# Patient Record
Sex: Male | Born: 1990 | Race: White | Hispanic: No | Smoking: Current every day smoker
Health system: Southern US, Community
[De-identification: ages and names within clinical notes are randomized; demographics above are authoritative.]

## PROBLEM LIST (undated history)

## (undated) DIAGNOSIS — K219 Gastro-esophageal reflux disease without esophagitis: Secondary | ICD-10-CM

## (undated) DIAGNOSIS — F4310 Post-traumatic stress disorder, unspecified: Secondary | ICD-10-CM

## (undated) DIAGNOSIS — F419 Anxiety disorder, unspecified: Secondary | ICD-10-CM

## (undated) HISTORY — DX: Gastro-esophageal reflux disease without esophagitis: K21.9

## (undated) HISTORY — PX: KNEE ARTHROSCOPY: SUR90

## (undated) HISTORY — PX: TESTICLE SURGERY: SHX794

## (undated) HISTORY — PX: FOOT ARTHROTOMY: SUR104

---

## 2009-11-12 ENCOUNTER — Emergency Department (HOSPITAL_COMMUNITY): Admission: EM | Admit: 2009-11-12 | Discharge: 2009-11-12 | Payer: Self-pay | Admitting: Emergency Medicine

## 2010-03-27 LAB — URINALYSIS, ROUTINE W REFLEX MICROSCOPIC
Bilirubin Urine: NEGATIVE
Glucose, UA: NEGATIVE mg/dL
Hgb urine dipstick: NEGATIVE
Specific Gravity, Urine: 1.028 (ref 1.005–1.030)
pH: 6.5 (ref 5.0–8.0)

## 2010-03-27 LAB — RAPID URINE DRUG SCREEN, HOSP PERFORMED
Cocaine: NOT DETECTED
Opiates: NOT DETECTED

## 2010-03-27 LAB — POCT I-STAT, CHEM 8
Calcium, Ion: 0.96 mmol/L — ABNORMAL LOW (ref 1.12–1.32)
Chloride: 106 mEq/L (ref 96–112)
Creatinine, Ser: 1.1 mg/dL (ref 0.4–1.5)
Glucose, Bld: 154 mg/dL — ABNORMAL HIGH (ref 70–99)
HCT: 50 % (ref 39.0–52.0)

## 2010-03-27 LAB — CBC
HCT: 46.8 % (ref 39.0–52.0)
Hemoglobin: 17.1 g/dL — ABNORMAL HIGH (ref 13.0–17.0)
MCH: 32.5 pg (ref 26.0–34.0)
MCHC: 36.5 g/dL — ABNORMAL HIGH (ref 30.0–36.0)
RBC: 5.26 MIL/uL (ref 4.22–5.81)

## 2010-03-27 LAB — DIFFERENTIAL
Basophils Relative: 0 % (ref 0–1)
Eosinophils Absolute: 0.1 10*3/uL (ref 0.0–0.7)
Lymphs Abs: 2 10*3/uL (ref 0.7–4.0)
Monocytes Absolute: 0.9 10*3/uL (ref 0.1–1.0)
Monocytes Relative: 6 % (ref 3–12)
Neutro Abs: 13.1 10*3/uL — ABNORMAL HIGH (ref 1.7–7.7)

## 2010-03-27 LAB — URINE MICROSCOPIC-ADD ON

## 2011-01-23 ENCOUNTER — Emergency Department
Admission: EM | Admit: 2011-01-23 | Discharge: 2011-01-23 | Disposition: A | Discharge status: Home / Self Care | Payer: Self-pay | Source: Home / Self Care | Attending: Family Medicine | Admitting: Family Medicine

## 2011-01-23 ENCOUNTER — Encounter: Payer: Self-pay | Admitting: Emergency Medicine

## 2011-01-23 DIAGNOSIS — J029 Acute pharyngitis, unspecified: Secondary | ICD-10-CM

## 2011-01-23 MED ORDER — AMOXICILLIN 875 MG PO TABS: 875.0000 mg | ORAL_TABLET | Freq: Two times a day (BID) | ORAL | Status: AC

## 2011-01-23 NOTE — ED Notes (Signed)
Sore throat, feverish, cough and hoarseness x 3 days. No recent OTCs. Did not get Flu vaccination this season. Working long hours.

## 2011-01-23 NOTE — ED Provider Notes (Signed)
History     CSN: [id number]  Arrival date & time 01/23/11  1151   First MD Initiated Contact with Patient 01/23/11 1227      Chief Complaint  Patient presents with  . Sore Throat      HPI Comments: Patient complains of 3 day history of sore throat and fatigue, with headache and low grade fever.  He has had no sinus congestion.  He developed a very mild cough 2 days ago.  He states that throat does not feel like past strep throats.  No myalgias.  He has not had a flu shot.  No earache.  No GI or GU symptoms.  Patient is a 21 y.o. male presenting with pharyngitis. The history is provided by the patient.  Sore Throat This is a new problem. Episode onset: 3 days ago. The problem occurs constantly. The problem has been gradually worsening. Associated symptoms include headaches. The symptoms are aggravated by swallowing. The symptoms are relieved by nothing. Treatments tried: Daquil and Nyquil. The treatment provided no relief.    History reviewed. No pertinent past medical history.  History reviewed. No pertinent past surgical history.  Family History  Problem Relation Age of Onset  . Hypertension Mother     History  Substance Use Topics  . Smoking status: Current Everyday Smoker  . Smokeless tobacco: Not on file  . Alcohol Use: No      Review of Systems  Neurological: Positive for headaches.   + sore throat No cough No pleuritic pain No wheezing No nasal congestion ? post-nasal drainage No sinus pain/pressure No itchy/red eyes No earache No hemoptysis No SOB + low grade fever/chills No nausea No vomiting No abdominal pain No diarrhea No urinary symptoms No skin rashes + fatigue No myalgias + headache Used OTC meds without relief  Allergies  Sulfa antibiotics  Home Medications   Current Outpatient Rx  Name Route Sig Dispense Refill  . AMOXICILLIN 875 MG PO TABS Oral Take 1 tablet (875 mg total) by mouth 2 (two) times daily. (Rx void after 01/31/11) 20  tablet 0    BP 113/68  Pulse 59  Temp(Src) 98.3 F (36.8 C) (Oral)  Resp 18  Ht 5\' 11"  (1.803 m)  Wt 148 lb (67.132 kg)  BMI 20.64 kg/m2  SpO2 99%  Physical Exam Nursing notes and Vital Signs reviewed. Appearance:  Patient appears healthy, stated age, and in no acute distress Eyes:  Pupils are equal, round, and reactive to light and accomodation.  Extraocular movement is intact.  Conjunctivae are not inflamed  Ears:  Right canal occluded with cerumin.  Left canal and TM normal. Nose:  Mildly congested turbinates.  No sinus tenderness.   Pharynx:  Normal Neck:  Supple.   No adenopathy Lungs:  Clear to auscultation.  Breath sounds are equal.  Heart:  Regular rate and rhythm without murmurs, rubs, or gallops.  Abdomen:  Nontender without masses or hepatosplenomegaly.  Bowel sounds are present.  No CVA or flank tenderness.  Extremities:  No edema.  No calf tenderness Skin:  No rash present.   ED Course  Procedures  none   Labs Reviewed  POCT RAPID STREP A (OFFICE) negative      1. Acute pharyngitis       MDM  There is no evidence of bacterial infection today.  Suspect early viral URI Treat symptomatically for now: Take Mucinex (guaifenesin) twice daily for cough and congestion.  May add Sudafed for sinus congestion  Increase fluid intake,  rest. May use Afrin nasal spray (or generic oxymetazoline) twice daily for about 5 days.  Also recommend using saline nasal spray several times daily and saline nasal irrigation. Stop all antihistamines for now, and other non-prescription cough/cold preparations. May take Delsym Cough Suppressant at bedtime for nighttime cough.  May take Ibuprofen 200mg , 4 tabs every 8 hours with food for sore throat. Begin Amoxicillin if not improving about 5 days or if persistent fever develops. Follow-up with family doctor if not improving 7 to 10 days.  Recommend flu shot when well.        Donna Christen, MD 01/23/11 1256

## 2013-02-19 ENCOUNTER — Emergency Department (INDEPENDENT_AMBULATORY_CARE_PROVIDER_SITE_OTHER): Payer: Self-pay

## 2013-02-19 ENCOUNTER — Emergency Department
Admission: EM | Admit: 2013-02-19 | Discharge: 2013-02-19 | Disposition: A | Discharge status: Home / Self Care | Payer: Self-pay | Source: Home / Self Care | Attending: Family Medicine | Admitting: Family Medicine

## 2013-02-19 ENCOUNTER — Ambulatory Visit (HOSPITAL_BASED_OUTPATIENT_CLINIC_OR_DEPARTMENT_OTHER)
Admit: 2013-02-19 | Discharge: 2013-02-19 | Disposition: A | Discharge status: Home / Self Care | Payer: Self-pay | Attending: Family Medicine | Admitting: Family Medicine

## 2013-02-19 ENCOUNTER — Encounter: Payer: Self-pay | Admitting: Emergency Medicine

## 2013-02-19 DIAGNOSIS — R109 Unspecified abdominal pain: Secondary | ICD-10-CM

## 2013-02-19 DIAGNOSIS — R110 Nausea: Secondary | ICD-10-CM | POA: Insufficient documentation

## 2013-02-19 DIAGNOSIS — M549 Dorsalgia, unspecified: Secondary | ICD-10-CM

## 2013-02-19 DIAGNOSIS — M4120 Other idiopathic scoliosis, site unspecified: Secondary | ICD-10-CM

## 2013-02-19 HISTORY — DX: Post-traumatic stress disorder, unspecified: F43.10

## 2013-02-19 HISTORY — DX: Anxiety disorder, unspecified: F41.9

## 2013-02-19 LAB — POCT URINALYSIS DIP (MANUAL ENTRY)
Blood, UA: NEGATIVE
Glucose, UA: NEGATIVE
LEUKOCYTES UA: NEGATIVE
NITRITE UA: NEGATIVE
PH UA: 7.5 (ref 5–8)
Spec Grav, UA: 1.025 (ref 1.005–1.03)
Urobilinogen, UA: 0.2 (ref 0–1)

## 2013-02-19 LAB — POCT CBC W AUTO DIFF (K'VILLE URGENT CARE)

## 2013-02-19 LAB — COMPREHENSIVE METABOLIC PANEL
ALBUMIN: 4.6 g/dL (ref 3.5–5.2)
ALT: 16 U/L (ref 0–53)
AST: 15 U/L (ref 0–37)
Alkaline Phosphatase: 52 U/L (ref 39–117)
BUN: 8 mg/dL (ref 6–23)
CALCIUM: 9.9 mg/dL (ref 8.4–10.5)
CHLORIDE: 102 meq/L (ref 96–112)
CO2: 28 mEq/L (ref 19–32)
Creat: 0.99 mg/dL (ref 0.50–1.35)
GLUCOSE: 88 mg/dL (ref 70–99)
POTASSIUM: 4 meq/L (ref 3.5–5.3)
Sodium: 138 mEq/L (ref 135–145)
Total Bilirubin: 0.7 mg/dL (ref 0.2–1.2)
Total Protein: 7.2 g/dL (ref 6.0–8.3)

## 2013-02-19 LAB — LIPASE: Lipase: 34 U/L (ref 0–75)

## 2013-02-19 MED ORDER — CEFTRIAXONE SODIUM 1 G IJ SOLR
1.0000 g | Freq: Once | INTRAMUSCULAR | Status: AC
  Administered 2013-02-19: 1 g via INTRAMUSCULAR

## 2013-02-19 MED ORDER — AZITHROMYCIN 250 MG PO TABS: ORAL_TABLET | ORAL | Status: DC

## 2013-02-19 MED ORDER — METRONIDAZOLE 500 MG PO TABS: 500.0000 mg | ORAL_TABLET | Freq: Three times a day (TID) | ORAL | Status: DC

## 2013-02-19 MED ORDER — IOHEXOL 300 MG/ML  SOLN
100.0000 mL | Freq: Once | INTRAMUSCULAR | Status: AC | PRN
  Administered 2013-02-19: 100 mL via INTRAVENOUS

## 2013-02-19 MED ORDER — CIPROFLOXACIN HCL 500 MG PO TABS: 500.0000 mg | ORAL_TABLET | Freq: Two times a day (BID) | ORAL | Status: DC

## 2013-02-19 NOTE — ED Provider Notes (Addendum)
CSN: [id number]     Arrival date & time 02/19/13  1206 History   First MD Initiated Contact with Patient 02/19/13 1241     Chief Complaint  Patient presents with  . Back Pain    HPI The patient presents today with back pain. Location: lumbar spine  Timing: present over last 3-4 das  Description: persistent low back pain. Unrelieved with rest.  Worse with: movement  Better with: nothing  Trauma: no known trauma. Does work in [employer].  Bladder/bowel incontinence: no Weakness: yes Fever/chills: yes. Subjective fevers at home. Pt states that he has had ? Diarrhea (multiple loose BMs) as well as dysuria over course of back pain. No prior hx/o IBD or STDs. One partner.  Night pain:yes Unexplained weight loss: no Cancer/immunosuppression: no PMH of osteoporosis or chronic steroid use:  no   Past Medical History  Diagnosis Date  . Anxiety   . Post traumatic stress disorder (PTSD)    History reviewed. No pertinent past surgical history. Family History  Problem Relation Age of Onset  . Hypertension Mother    History  Substance Use Topics  . Smoking status: Current Every Day Smoker  . Smokeless tobacco: Never Used  . Alcohol Use: No    Review of Systems  All other systems reviewed and are negative.    Allergies  Demerol and Sulfa antibiotics  Home Medications  No current outpatient prescriptions on file. BP 112/69  Pulse 59  Temp(Src) 97.8 F (36.6 C) (Oral)  Resp 20  Ht 5\' 11"  (1.803 m)  Wt 150 lb (68.04 kg)  BMI 20.93 kg/m2  SpO2 100% Physical Exam  Constitutional: He appears well-developed and well-nourished.  HENT:  Head: Normocephalic and atraumatic.  Eyes: Conjunctivae are normal. Pupils are equal, round, and reactive to light.  Neck: Normal range of motion. Neck supple.  Cardiovascular: Normal rate and regular rhythm.   Pulmonary/Chest: Effort normal and breath sounds normal.  Abdominal: Soft.  + TTP > 6/10 in abdomen Most predominant in lower  abdomen.    Genitourinary: Prostate normal and penis normal.  No prostatic TTP    Musculoskeletal:       Arms: Marked TTP in lumbar region    Neurological: He is alert.  Skin: Skin is warm and dry.    ED Course  Procedures (including critical care time) Labs Review Labs Reviewed  POCT URINALYSIS DIP (MANUAL ENTRY)   Imaging Review Dg Lumbar Spine Complete  02/19/2013   CLINICAL DATA:  Low back pain  EXAM: LUMBAR SPINE - COMPLETE 4+ VIEW  COMPARISON:  None.  FINDINGS: Frontal, lateral, spot lumbosacral lateral, and bilateral oblique views were obtained. There are 5 non-rib-bearing lumbar type vertebral bodies. There is thoracic dextroscoliosis. There is no fracture or spondylolisthesis. Disc spaces appear intact. There is no appreciable facet arthropathy.  IMPRESSION: Scoliosis.  No appreciable fracture or arthropathy.   Electronically Signed   By: Bretta BangWilliam  Woodruff M.D.   On: 02/19/2013 13:04      MDM   1. Back pain   2. Abdominal pain, unspecified site    Given constellation of abdominal pain, back pain, ?diarrhea, dysuria, ddx for sxs is extremely broad including lumbar strain, nephrolithiasis, colitis, pyelonephritis, appendicitis, pancreatitis.  L spine xray does show scoliosis. Pt states this is a chronic issue.  No prostatic tenderness on exam today.  UA negative for LE or nitrites.  WBC ULN @ 9.9. Afebrile.  Will place pt on broad spectrum abx coverage.  Rocephin 1gm IM x1 Azithromycin  1gm PO x1  cipro and flagyl  Given degree of abd pain, will send for CT abd and pelvis with IV and oral contrast to better assess intra-abdominal anatomy.  Check STD lab work, also check CMET, lipase, urine culture. NSAIDs for pain.   Will follow up pending imaging.     The patient and/or caregiver has been counseled thoroughly with regard to treatment plan and/or medications prescribed including dosage, schedule, interactions, rationale for use, and possible side effects and they  verbalize understanding. Diagnoses and expected course of recovery discussed and will return if not improved as expected or if the condition worsens. Patient and/or caregiver verbalized understanding.      Clinical update;  CT Abd and Pelvis negative for any intraabdominal pathology. Discussed results at time of read with pt.  Complete treatment course.  Follow up with PCP if sxs persist   Doree Albee, MD 02/19/13 1354  Doree Albee, MD 02/22/13 [phone]

## 2013-02-19 NOTE — ED Notes (Signed)
Patient c/o bilateral low back pain which woke him up 3 nights ago and has not left. Patient tried otc Goody pack without relief.

## 2013-02-20 LAB — HIV ANTIBODY (ROUTINE TESTING W REFLEX): HIV: NONREACTIVE

## 2013-02-20 LAB — RPR

## 2013-02-21 LAB — URINE CULTURE: Colony Count: 4000

## 2013-02-22 LAB — GC/CHLAMYDIA PROBE AMP, URINE
Chlamydia, Swab/Urine, PCR: NEGATIVE
GC PROBE AMP, URINE: NEGATIVE

## 2014-04-01 ENCOUNTER — Emergency Department
Admission: EM | Admit: 2014-04-01 | Discharge: 2014-04-01 | Disposition: A | Discharge status: Home / Self Care | Payer: Self-pay | Source: Home / Self Care | Attending: Family Medicine | Admitting: Family Medicine

## 2014-04-01 ENCOUNTER — Encounter: Payer: Self-pay | Admitting: Emergency Medicine

## 2014-04-01 DIAGNOSIS — L723 Sebaceous cyst: Secondary | ICD-10-CM

## 2014-04-01 DIAGNOSIS — L089 Local infection of the skin and subcutaneous tissue, unspecified: Secondary | ICD-10-CM

## 2014-04-01 MED ORDER — CEPHALEXIN 500 MG PO CAPS: 500.0000 mg | ORAL_CAPSULE | Freq: Three times a day (TID) | ORAL | Status: DC

## 2014-04-01 MED ORDER — HYDROCODONE-ACETAMINOPHEN 5-325 MG PO TABS: 1.0000 | ORAL_TABLET | Freq: Four times a day (QID) | ORAL | Status: DC | PRN

## 2014-04-01 MED ORDER — TRAMADOL HCL 50 MG PO TABS: 50.0000 mg | ORAL_TABLET | Freq: Four times a day (QID) | ORAL | Status: DC | PRN

## 2014-04-01 NOTE — ED Provider Notes (Signed)
Raymond SinnerJoseph Sanders is a 24 y.o. male who presents to Urgent Care today for abscess. Patient has a painful swelling on the posterior neck. He's had a long-standing nontender firm cyst area on the posterior neck. Over the past few days became red and painful. He attempted to drain it himself with a razor blade which did not help. He notes continued pain. He denies any drainage. No fevers or chills.   Past Medical History  Diagnosis Date  . Anxiety   . Post traumatic stress disorder (PTSD)    Past Surgical History  Procedure Laterality Date  . Foot arthrotomy     History  Substance Use Topics  . Smoking status: Current Every Day Smoker  . Smokeless tobacco: Never Used  . Alcohol Use: Yes   ROS as above Medications: No current facility-administered medications for this encounter.   Current Outpatient Prescriptions  Medication Sig Dispense Refill  . cephALEXin (KEFLEX) 500 MG capsule Take 1 capsule (500 mg total) by mouth 3 (three) times daily. 21 capsule 0  . HYDROcodone-acetaminophen (NORCO/VICODIN) 5-325 MG per tablet Take 1 tablet by mouth every 6 (six) hours as needed. 10 tablet 0   Allergies  Allergen Reactions  . Demerol [Meperidine]   . Sulfa Antibiotics      Exam:  BP 108/62 mmHg  Pulse 70  Temp(Src) 98.1 F (36.7 C) (Oral)  Wt 149 lb (67.586 kg)  SpO2 99% Gen: Well NAD Skin:  Large erythematous fluctuant mass posterior neck. Tender to touch.  Abscess incision and drainage. Consent obtained and timeout performed. Skin cleaned with alcohol, and cold spray applied. 2 mL of lidocaine with epinephrine injected achieving good anesthesia. Skin was again cleaned with alcohol. A sharp incision was made to the area of fluctuance. The incision was widened and pus and sebaceous material was expressed. Pus was cultured. Blunt dissection was used to break up loculations. Further pus was expressed.  A large amount of cyst wall was removed. Patient tolerated the procedure well. A  dressing was applied   No results found for this or any previous visit (from the past 24 hour(s)). No results found.  Assessment and Plan: 24 y.o. male with infected sebaceous cyst. Culture pending. Treat with Keflex and Norco. Follow-up with PCP. Recommended follow-up with general surgery for definitive removal when no longer infected.  Discussed warning signs or symptoms. Please see discharge instructions. Patient expresses understanding.     Raymond BongEvan S Aashir Umholtz, MD 04/01/14 [phone]

## 2014-04-01 NOTE — Discharge Instructions (Signed)
Thank you for coming in today. ° °Abscess °Care After °An abscess (also called a boil or furuncle) is an infected area that contains a collection of pus. Signs and symptoms of an abscess include pain, tenderness, redness, or hardness, or you may feel a moveable soft area under your skin. An abscess can occur anywhere in the body. The infection may spread to surrounding tissues causing cellulitis. A cut (incision) by the surgeon was made over your abscess and the pus was drained out. Gauze may have been packed into the space to provide a drain that will allow the cavity to heal from the inside outwards. The boil may be painful for 5 to 7 days. Most people with a boil do not have high fevers. Your abscess, if seen early, may not have localized, and may not have been lanced. If not, another appointment may be required for this if it does not get better on its own or with medications. °HOME CARE INSTRUCTIONS  °· Only take over-the-counter or prescription medicines for pain, discomfort, or fever as directed by your caregiver. °· When you bathe, soak and then remove gauze or iodoform packs at least daily or as directed by your caregiver. You may then wash the wound gently with mild soapy water. Repack with gauze or do as your caregiver directs. °SEEK IMMEDIATE MEDICAL CARE IF:  °· You develop increased pain, swelling, redness, drainage, or bleeding in the wound site. °· You develop signs of generalized infection including muscle aches, chills, fever, or a general ill feeling. °· An oral temperature above 102° F (38.9° C) develops, not controlled by medication. °See your caregiver for a recheck if you develop any of the symptoms described above. If medications (antibiotics) were prescribed, take them as directed. °Document Released: 07/18/2004 Document Revised: 03/24/2011 Document Reviewed: 03/15/2007 °ExitCare® Patient Information ©2015 [employer]. This information is not intended to replace advice given to you by your  health care provider. Make sure you discuss any questions you have with your health care provider. ° °Epidermal Cyst °An epidermal cyst is sometimes called a sebaceous cyst, epidermal inclusion cyst, or infundibular cyst. These cysts usually contain a substance that looks "pasty" or "cheesy" and may have a bad smell. This substance is a protein called keratin. Epidermal cysts are usually found on the face, neck, or trunk. They may also occur in the vaginal area or other parts of the genitalia of both men and women. Epidermal cysts are usually small, painless, slow-growing bumps or lumps that move freely under the skin. It is important not to try to pop them. This may cause an infection and lead to tenderness and swelling. °CAUSES  °Epidermal cysts may be caused by a deep penetrating injury to the skin or a plugged hair follicle, often associated with acne. °SYMPTOMS  °Epidermal cysts can become inflamed and cause: °· Redness. °· Tenderness. °· Increased temperature of the skin over the bumps or lumps. °· Grayish-white, bad smelling material that drains from the bump or lump. °DIAGNOSIS  °Epidermal cysts are easily diagnosed by your caregiver during an exam. Rarely, a tissue sample (biopsy) may be taken to rule out other conditions that may resemble epidermal cysts. °TREATMENT  °· Epidermal cysts often get better and disappear on their own. They are rarely ever cancerous. °· If a cyst becomes infected, it may become inflamed and tender. This may require opening and draining the cyst. Treatment with antibiotics may be necessary. When the infection is gone, the cyst may be removed with   removed with minor surgery.  Small, inflamed cysts can often be treated with antibiotics or by injecting steroid medicines.  Sometimes, epidermal cysts become large and bothersome. If this happens, surgical removal in your caregiver's office may be necessary. HOME CARE INSTRUCTIONS  Only take over-the-counter or prescription medicines as  directed by your caregiver.  Take your antibiotics as directed. Finish them even if you start to feel better. SEEK MEDICAL CARE IF:   Your cyst becomes tender, red, or swollen.  Your condition is not improving or is getting worse.  You have any other questions or concerns. MAKE SURE YOU:  Understand these instructions.  Will watch your condition.  Will get help right away if you are not doing well or get worse. Document Released: 12/01/2003 Document Revised: 03/24/2011 Document Reviewed: 07/08/2010 [facility] Patient Information 2015 [employer], [place]. This information is not intended to replace advice given to you by your health care provider. Make sure you discuss any questions you have with your health care provider.

## 2014-04-01 NOTE — ED Notes (Signed)
Pt c/o cyst on neck that he states has been there for years but got much larger and painful last week. He tried to lance it at home x2 days ago and c/o worsening redness, swelling and pain.

## 2014-04-04 LAB — CULTURE, ROUTINE-ABSCESS: Gram Stain: NONE SEEN

## 2014-04-06 ENCOUNTER — Telehealth: Payer: Self-pay | Admitting: Emergency Medicine

## 2016-08-04 ENCOUNTER — Emergency Department (HOSPITAL_COMMUNITY): Payer: BLUE CROSS/BLUE SHIELD

## 2016-08-04 ENCOUNTER — Encounter (HOSPITAL_COMMUNITY): Payer: Self-pay | Admitting: Emergency Medicine

## 2016-08-04 ENCOUNTER — Emergency Department (HOSPITAL_COMMUNITY)
Admission: EM | Admit: 2016-08-04 | Discharge: 2016-08-04 | Disposition: A | Discharge status: Home / Self Care | Payer: BLUE CROSS/BLUE SHIELD | Attending: Emergency Medicine | Admitting: Emergency Medicine

## 2016-08-04 DIAGNOSIS — R630 Anorexia: Secondary | ICD-10-CM | POA: Diagnosis not present

## 2016-08-04 DIAGNOSIS — Z79899 Other long term (current) drug therapy: Secondary | ICD-10-CM | POA: Diagnosis not present

## 2016-08-04 DIAGNOSIS — R197 Diarrhea, unspecified: Secondary | ICD-10-CM | POA: Diagnosis not present

## 2016-08-04 DIAGNOSIS — R1013 Epigastric pain: Secondary | ICD-10-CM | POA: Diagnosis not present

## 2016-08-04 LAB — COMPREHENSIVE METABOLIC PANEL
ALBUMIN: 4.4 g/dL (ref 3.5–5.0)
ALK PHOS: 49 U/L (ref 38–126)
ALT: 21 U/L (ref 17–63)
ANION GAP: 8 (ref 5–15)
AST: 23 U/L (ref 15–41)
BILIRUBIN TOTAL: 1.1 mg/dL (ref 0.3–1.2)
BUN: 11 mg/dL (ref 6–20)
CO2: 26 mmol/L (ref 22–32)
Calcium: 9.3 mg/dL (ref 8.9–10.3)
Chloride: 108 mmol/L (ref 101–111)
Creatinine, Ser: 0.9 mg/dL (ref 0.61–1.24)
GFR calc Af Amer: >60 mL/min (ref >60)
GFR calc non Af Amer: >60 mL/min (ref >60)
GLUCOSE: 80 mg/dL (ref 65–99)
POTASSIUM: 3.7 mmol/L (ref 3.5–5.1)
Sodium: 142 mmol/L (ref 135–145)
Total Protein: 6.6 g/dL (ref 6.5–8.1)

## 2016-08-04 LAB — CBC
HEMATOCRIT: 40.9 % (ref 39.0–52.0)
HEMOGLOBIN: 14.5 g/dL (ref 13.0–17.0)
MCH: 31.2 pg (ref 26.0–34.0)
MCHC: 35.5 g/dL (ref 30.0–36.0)
MCV: 88 fL (ref 78.0–100.0)
Platelets: 176 10*3/uL (ref 150–400)
RBC: 4.65 MIL/uL (ref 4.22–5.81)
RDW: 12.4 % (ref 11.5–15.5)
WBC: 9 10*3/uL (ref 4.0–10.5)

## 2016-08-04 LAB — URINALYSIS, ROUTINE W REFLEX MICROSCOPIC
BILIRUBIN URINE: NEGATIVE
GLUCOSE, UA: NEGATIVE mg/dL
Hgb urine dipstick: NEGATIVE
KETONES UR: NEGATIVE mg/dL
Leukocytes, UA: NEGATIVE
Nitrite: NEGATIVE
PH: 7 (ref 5.0–8.0)
Protein, ur: NEGATIVE mg/dL
SPECIFIC GRAVITY, URINE: 1.02 (ref 1.005–1.030)

## 2016-08-04 LAB — LIPASE, BLOOD: Lipase: 44 U/L (ref 11–51)

## 2016-08-04 MED ORDER — SUCRALFATE 1 G PO TABS: 1.0000 g | ORAL_TABLET | Freq: Three times a day (TID) | ORAL | 0 refills | Status: DC

## 2016-08-04 MED ORDER — IOPAMIDOL (ISOVUE-300) INJECTION 61%
INTRAVENOUS | Status: AC
  Administered 2016-08-04: 100 mL
  Filled 2016-08-04: qty 100

## 2016-08-04 MED ORDER — OMEPRAZOLE 20 MG PO CPDR: 20.0000 mg | DELAYED_RELEASE_CAPSULE | Freq: Two times a day (BID) | ORAL | 0 refills | Status: DC

## 2016-08-04 MED ORDER — SUCRALFATE 1 GM/10ML PO SUSP: 1.0000 g | Freq: Three times a day (TID) | ORAL | 0 refills | Status: DC

## 2016-08-04 NOTE — ED Provider Notes (Signed)
[facility]-EMERGENCY DEPT Provider Note   CSN: [id number] Arrival date & time: 08/04/16  1641     History   Chief Complaint Chief Complaint  Patient presents with  . Abdominal Pain  . Diarrhea    HPI Raymond Sanders is a 26 y.o. male.  HPI Patient presents with abdominal pain. Has had it for the last month. Since the epigastric area. Somewhat constant but worse after eating. States the pain gets severe. No nausea or vomiting. No diarrhea. No fevers or chills. He states he has lost some weight does not know how much. States he had seen in urgent care and I started him on some over-the-counter medicines. States does not help. Has plans to follow-up with gastroenterology they cannot get him in for another month and half. States he had some diarrhea today. No nausea or vomiting. Pain is worse after drinking alcohol also. Denies other substance abuse.   Past Medical History:  Diagnosis Date  . Anxiety   . Post traumatic stress disorder (PTSD)     There are no active problems to display for this patient.   Past Surgical History:  Procedure Laterality Date  . FOOT ARTHROTOMY         Home Medications    Prior to Admission medications   Medication Sig Start Date End Date Taking? Authorizing Provider  cephALEXin (KEFLEX) 500 MG capsule Take 1 capsule (500 mg total) by mouth 3 (three) times daily. 04/01/14   Rodolph Bongorey, Evan S, MD  HYDROcodone-acetaminophen (NORCO/VICODIN) 5-325 MG per tablet Take 1 tablet by mouth every 6 (six) hours as needed. 04/01/14   Rodolph Bongorey, Evan S, MD  omeprazole (PRILOSEC) 20 MG capsule Take 1 capsule (20 mg total) by mouth 2 (two) times daily before a meal. 08/04/16   Benjiman CorePickering, Kamron Vanwyhe, MD  sucralfate (CARAFATE) 1 g tablet Take 1 tablet (1 g total) by mouth 4 (four) times daily -  with meals and at bedtime. 08/04/16   Benjiman CorePickering, Shandrell Boda, MD  sucralfate (CARAFATE) 1 GM/10ML suspension Take 10 mLs (1 g total) by mouth 4 (four) times daily -  with meals and at bedtime.  08/04/16   Benjiman CorePickering, Aviyon Hocevar, MD    Family History Family History  Problem Relation Age of Onset  . Hypertension Mother     Social History Social History  Substance Use Topics  . Smoking status: Current Every Day Smoker  . Smokeless tobacco: Current User  . Alcohol use Yes     Allergies   Demerol [meperidine] and Sulfa antibiotics   Review of Systems Review of Systems  Constitutional: Positive for appetite change.  HENT: Negative for congestion.   Respiratory: Negative for shortness of breath.   Cardiovascular: Negative for chest pain.  Gastrointestinal: Positive for abdominal pain and diarrhea.  Genitourinary: Negative for hematuria.  Musculoskeletal: Negative for back pain.  Skin: Negative for color change.  Neurological: Negative for numbness.  Hematological: Negative for adenopathy.  Psychiatric/Behavioral: Negative for confusion.     Physical Exam Updated Vital Signs BP 107/68   Pulse (!) 58   Temp 98.4 F (36.9 C) (Oral)   Resp 16   Ht 5\' 10"  (1.778 m)   Wt 68 kg (150 lb)   SpO2 99%   BMI 21.52 kg/m   Physical Exam  Constitutional: He appears well-developed and well-nourished.  HENT:  Head: Normocephalic.  Eyes: EOM are normal.  Neck: Neck supple.  Cardiovascular: Normal rate.   Abdominal: There is tenderness.  Epigastric tenderness without mass rebound or guarding.  Musculoskeletal: Normal  range of motion. He exhibits edema.  Neurological: He is alert.  Skin: Skin is warm. Capillary refill takes less than 2 seconds.  Psychiatric: He has a normal mood and affect.     ED Treatments / Results  Labs (all labs ordered are listed, but only abnormal results are displayed) Labs Reviewed  URINALYSIS, ROUTINE W REFLEX MICROSCOPIC - Abnormal; Notable for the following:       Result Value   APPearance CLOUDY (*)    All other components within normal limits  LIPASE, BLOOD  COMPREHENSIVE METABOLIC PANEL  CBC    EKG  EKG Interpretation None         Radiology Ct Abdomen Pelvis W Contrast  Result Date: 08/04/2016 CLINICAL DATA:  Upper abdominal pain for 1 month. Currently with diarrhea and postprandial pain. EXAM: CT ABDOMEN AND PELVIS WITH CONTRAST TECHNIQUE: Multidetector CT imaging of the abdomen and pelvis was performed using the standard protocol following bolus administration of intravenous contrast. CONTRAST:  ISOVUE-300 IOPAMIDOL (ISOVUE-300) INJECTION 61% COMPARISON:  02/19/2013 FINDINGS: Lower chest: No acute abnormality. Hepatobiliary: No focal liver abnormality is seen. No gallstones, gallbladder wall thickening, or biliary dilatation. Pancreas: Unremarkable. No pancreatic ductal dilatation or surrounding inflammatory changes. Spleen: Normal in size without focal abnormality. Adrenals/Urinary Tract: Adrenal glands are unremarkable. Kidneys are normal, without focal lesion, or hydronephrosis. Bladder is unremarkable. Stomach/Bowel: Stomach is within normal limits. Appendix is normal. No evidence of bowel wall thickening, distention, or inflammatory changes. Vascular/Lymphatic: No significant vascular findings are present. No enlarged abdominal or pelvic lymph nodes. Reproductive: Unremarkable Other: No focal inflammation.  No ascites. Musculoskeletal: No acute or significant osseous findings. IMPRESSION: No significant abnormality. Electronically Signed   By: Ellery Plunk M.D.   On: 08/04/2016 22:30    Procedures Procedures (including critical care time)  Medications Ordered in ED Medications  iopamidol (ISOVUE-300) 61 % injection (100 mLs  Contrast Given 08/04/16 2155)     Initial Impression / Assessment and Plan / ED Course  I have reviewed the triage vital signs and the nursing notes.  Pertinent labs & imaging results that were available during my care of the patient were reviewed by me and considered in my medical decision making (see chart for details).     Patient with abdominal pain and diarrhea.  Epigastric pain. Has had for a month and had been on some antacid without relief. CT scan done and reassuring. Will follow-up with gastroenterology.  Final Clinical Impressions(s) / ED Diagnoses   Final diagnoses:  Epigastric abdominal pain    New Prescriptions Discharge Medication List as of 08/04/2016 10:38 PM    START taking these medications   Details  omeprazole (PRILOSEC) 20 MG capsule Take 1 capsule (20 mg total) by mouth 2 (two) times daily before a meal., Starting Mon 08/04/2016, Print    sucralfate (CARAFATE) 1 g tablet Take 1 tablet (1 g total) by mouth 4 (four) times daily -  with meals and at bedtime., Starting Mon 08/04/2016, Print    sucralfate (CARAFATE) 1 GM/10ML suspension Take 10 mLs (1 g total) by mouth 4 (four) times daily -  with meals and at bedtime., Starting Mon 08/04/2016, Print         Benjiman Core, MD 08/04/16 [phone]

## 2016-08-04 NOTE — ED Notes (Signed)
Patient seen leaving ED and heard stating he was "going to get some food"

## 2016-08-04 NOTE — ED Notes (Signed)
Called pt X2 for urine sample

## 2016-08-04 NOTE — ED Notes (Signed)
Pt verbalized understanding of d/c instructions and has no further questions. Pt is stable, A&Ox4, VSS.  

## 2016-08-04 NOTE — ED Triage Notes (Signed)
Pt. Stated, I've had stomach pain for a month.  I had diarrhea today. When I have the stomach pain, Ive not been able to eat.  I have noticed the sclera in my eyes are yellow.

## 2016-08-08 ENCOUNTER — Encounter: Payer: Self-pay | Admitting: Gastroenterology

## 2016-08-13 ENCOUNTER — Encounter: Payer: Self-pay | Admitting: Gastroenterology

## 2016-08-13 ENCOUNTER — Ambulatory Visit (INDEPENDENT_AMBULATORY_CARE_PROVIDER_SITE_OTHER): Payer: BLUE CROSS/BLUE SHIELD | Admitting: Gastroenterology

## 2016-08-13 ENCOUNTER — Telehealth: Payer: Self-pay | Admitting: Gastroenterology

## 2016-08-13 VITALS — BP 84/52 | HR 68 | Ht 70.0 in | Wt 142.0 lb

## 2016-08-13 DIAGNOSIS — R1013 Epigastric pain: Secondary | ICD-10-CM | POA: Diagnosis not present

## 2016-08-13 DIAGNOSIS — R1310 Dysphagia, unspecified: Secondary | ICD-10-CM | POA: Diagnosis not present

## 2016-08-13 DIAGNOSIS — R634 Abnormal weight loss: Secondary | ICD-10-CM

## 2016-08-13 MED ORDER — TRAMADOL HCL 50 MG PO TABS: 50.0000 mg | ORAL_TABLET | Freq: Three times a day (TID) | ORAL | 0 refills | Status: DC | PRN

## 2016-08-13 MED ORDER — OMEPRAZOLE 40 MG PO CPDR: 40.0000 mg | DELAYED_RELEASE_CAPSULE | Freq: Two times a day (BID) | ORAL | 2 refills | Status: DC

## 2016-08-13 MED ORDER — SUCRALFATE 1 GM/10ML PO SUSP: 1.0000 g | Freq: Three times a day (TID) | ORAL | 1 refills | Status: DC

## 2016-08-13 NOTE — Telephone Encounter (Signed)
Tramadol faxed to [facility].

## 2016-08-13 NOTE — Patient Instructions (Signed)
You have been scheduled for an endoscopy. Please follow written instructions given to you at your visit today. If you use inhalers (even only as needed), please bring them with you on the day of your procedure. Your physician has requested that you go to www.startemmi.com and enter the access code given to you at your visit today. This web site gives a general overview about your procedure. However, you should still follow specific instructions given to you by our office regarding your preparation for the procedure.  We have sent the following medications to your pharmacy for you to pick up at your convenience: Omeprazole Carafate Tramadol

## 2016-08-13 NOTE — Progress Notes (Addendum)
08/13/2016 Raymond Sanders [id number] March 17, 1990   HISTORY OF PRESENT ILLNESS:  This is a pleasant 26 year old male who is new to our office. He presents to our office today with complaints of about 6 weeks of epigastric abdominal pain. He says that initially he was only having pain when he would eat and the food seemed to travel through that area and cause an excruciating amount of pain. Now it is to the point where he has having chronic pain/discomfort but much worse when he eats. He says that he knows he does not eat right and previously he had been going all day without eating and then eating extremely large meals at dinnertime. He has tried to completely change his diet and his eating habits, but really has not been wanting to eat because of the pain. He says he lost 8 pounds in about 2 weeks. He denies NSAID use.  He says that he may have had intermittent heartburn and reflux, but nothing on a regular basis in the past. He was seen in the emergency room where a CT scan of the abdomen and pelvis with contrast was normal. CBC, CMP, lipase, urinalysis were normal. He was given omeprazole 20 mg twice daily, Carafate tablet 4 times a day, and told to follow-up with GI. He really does not really think the medication has helped much thus far. He has been on medications for about the past week.  Referred by Lindaann PascalScott Long, PA-C.   Past Medical History:  Diagnosis Date  . Anxiety   . GERD (gastroesophageal reflux disease)   . Post traumatic stress disorder (PTSD)    Past Surgical History:  Procedure Laterality Date  . FOOT ARTHROTOMY    . KNEE ARTHROSCOPY    . TESTICLE SURGERY      reports that he has been smoking Cigarettes.  He uses smokeless tobacco. He reports that he drinks alcohol. He reports that he uses drugs, including Marijuana. family history includes Hypertension in his mother. Allergies  Allergen Reactions  . Demerol [Meperidine] Other (See Comments)    Anger increase   . Sulfa  Antibiotics Hives      Outpatient Encounter Prescriptions as of 08/13/2016  Medication Sig  . APPLE CIDER VINEGAR PO Take by mouth.  . Omega-3 Fatty Acids (FISH OIL CONCENTRATE PO) Take by mouth.  . [DISCONTINUED] omeprazole (PRILOSEC) 20 MG capsule Take 1 capsule (20 mg total) by mouth 2 (two) times daily before a meal.  . [DISCONTINUED] sucralfate (CARAFATE) 1 g tablet Take 1 tablet (1 g total) by mouth 4 (four) times daily -  with meals and at bedtime.  Marland Kitchen. omeprazole (PRILOSEC) 40 MG capsule Take 1 capsule (40 mg total) by mouth 2 (two) times daily.  . sucralfate (CARAFATE) 1 GM/10ML suspension Take 10 mLs (1 g total) by mouth 4 (four) times daily -  with meals and at bedtime.  . traMADol (ULTRAM) 50 MG tablet Take 1 tablet (50 mg total) by mouth every 8 (eight) hours as needed.  . [DISCONTINUED] cephALEXin (KEFLEX) 500 MG capsule Take 1 capsule (500 mg total) by mouth 3 (three) times daily.  . [DISCONTINUED] HYDROcodone-acetaminophen (NORCO/VICODIN) 5-325 MG per tablet Take 1 tablet by mouth every 6 (six) hours as needed.  . [DISCONTINUED] sucralfate (CARAFATE) 1 GM/10ML suspension Take 10 mLs (1 g total) by mouth 4 (four) times daily -  with meals and at bedtime.   No facility-administered encounter medications on file as of 08/13/2016.      REVIEW  OF SYSTEMS  : All other systems reviewed and negative except where noted in the History of Present Illness.   PHYSICAL EXAM: BP (!) 84/52   Pulse 68   Ht 5\' 10"  (1.778 m)   Wt 142 lb (64.4 kg)   BMI 20.37 kg/m  General: Well developed white male in no acute distress; somewhat anxious Head: Normocephalic and atraumatic Eyes:  Sclerae anicteric, conjunctiva pink. Ears: Normal auditory acuity Lungs: Clear throughout to auscultation; no increased WOB. Heart: Regular rate and rhythm; no M/R/G. Abdomen: Soft, non-distended.  BS present.  Mild to moderate TTP in epigastrium. Musculoskeletal: Symmetrical with no gross deformities  Skin: No  lesions on visible extremities Extremities: No edema  Neurological: Alert oriented x 4, grossly non-focal Psychological:  Alert and cooperative. Normal mood and affect  ASSESSMENT AND PLAN: -26 year old male with about 6 weeks of epigastric abdominal pain.  First occurred only when he would eat and food travelled down his esophagus, but now having constant discomfort/pain.  Says that he has lost 8 pounds in 2 weeks due to not wanting to eat.  Will increase omeprazole to 40 mg BID for now, will continue carafate but will give suspension instead.  Will schedule for EGD, which is going to be done by Dr. Myrtie Neitheranis later this week.  He is asking for a small amount of pain medication and I explained to him that we do not usually provide pain medication here especially when we don't even have a definite diagnosis that we are treating. He was understanding and I do not think that he is drug seeking. I did agree to give him a very small amount of tramadol to last the next 2 days until his procedure.  *The risks, benefits, and alternatives to EGD were discussed with the patient and he consents to proceed.   CC:  Scott Long, PA-C  Thank you for sending this case to me. I have reviewed the entire note, and the outlined plan seems appropriate.   Amada JupiterHenry Danis, MD

## 2016-08-14 ENCOUNTER — Telehealth: Payer: Self-pay | Admitting: Gastroenterology

## 2016-08-14 ENCOUNTER — Telehealth: Payer: Self-pay

## 2016-08-14 DIAGNOSIS — R634 Abnormal weight loss: Secondary | ICD-10-CM

## 2016-08-14 DIAGNOSIS — R1013 Epigastric pain: Secondary | ICD-10-CM

## 2016-08-14 NOTE — Telephone Encounter (Signed)
Patient called and left message that his BP was 84/52 at his office visit yesterday and he felt dizzy upon standing this morning. I returned patient's call. Patient confirms above complaints, states he felt dizzy upon standing this morning, felt like he was going to pass out and has a general feeling of malaise and headache since yesterday. Patient has not checked his BP since his office visit yesterday. I asked the patient if he has been drinking enough fluids and he said he drinks gatorade, juice and water regularly. I see the patient was prescribed Ultram at his office visit yesterday and asked him if his symptoms began after he took the Ultram, but patient states he hadn't even had it filled yet when his symptoms began. I asked the patient if he has had these symptoms before and he states he does experience dizziness a couple of times a month. Patient states he does not have a PCP as he's been fairly healthy  when I asked him. I suggested to patient that he increase his fluid intake and that I will consult with Dr. Myrtie Neitheranis and get back with him. Patient also asks if this could be thyroid related and if we can check thyroid levels when he comes in for his EGD tomorrow. I told patient that we will address this issue of dizziness, but encouraged him to find a PCP.

## 2016-08-14 NOTE — Telephone Encounter (Signed)
Spoke with patient after receiving instructions from Dr. Myrtie Neitheranis. Instructed patient to continue to drink fluids and that we are moving his procedure up to 0830 tomorrow morning. No solid food after midnight tonight. Stop drinking fluids at 0530 tomorrow morning (don't even have the smallest sip of water after 0530). Please stop by our lab in the basement at 0730 tomorrow to have blood drawn for thyroid levels prior to your procedure. Patient verbalizes understanding of above and states that he will look into obtaining a PCP at our primary care clinic on the first floor.

## 2016-08-14 NOTE — Telephone Encounter (Signed)
Patient is already aware of the new plan. Labs entered. He asked about additional labs. I instructed him to schedule with a PCP. When he was in clinic I told him to stop at [facility] primary on the first floor, he admits he did not do that, again I recommend he establish with a PCP. He states clear understanding.

## 2016-08-14 NOTE — Telephone Encounter (Signed)
Oral fluids today We'll see what his BP is tomorrow. Needs PCP to address concerns of thyroid

## 2016-08-14 NOTE — Telephone Encounter (Signed)
New plan:   looks like I have an 8:30 procedure slot open tomorrow (had a cancellation). Please have this patient come to the lab in our building at 7:30, have a TSH, free T4 and cortisol level drawn.  [facility].

## 2016-08-15 ENCOUNTER — Ambulatory Visit (AMBULATORY_SURGERY_CENTER): Payer: BLUE CROSS/BLUE SHIELD | Admitting: Gastroenterology

## 2016-08-15 ENCOUNTER — Other Ambulatory Visit (INDEPENDENT_AMBULATORY_CARE_PROVIDER_SITE_OTHER): Payer: BLUE CROSS/BLUE SHIELD

## 2016-08-15 ENCOUNTER — Encounter: Payer: Self-pay | Admitting: Gastroenterology

## 2016-08-15 VITALS — BP 92/44 | HR 58 | Temp 98.9°F | Resp 11 | Ht 70.0 in | Wt 142.0 lb

## 2016-08-15 DIAGNOSIS — R1013 Epigastric pain: Secondary | ICD-10-CM | POA: Diagnosis not present

## 2016-08-15 DIAGNOSIS — R634 Abnormal weight loss: Secondary | ICD-10-CM | POA: Diagnosis not present

## 2016-08-15 DIAGNOSIS — K3189 Other diseases of stomach and duodenum: Secondary | ICD-10-CM

## 2016-08-15 LAB — TSH: TSH: 5.12 u[IU]/mL — ABNORMAL HIGH (ref 0.35–4.50)

## 2016-08-15 LAB — T4, FREE: FREE T4: 0.97 ng/dL (ref 0.60–1.60)

## 2016-08-15 LAB — CORTISOL: CORTISOL PLASMA: 13.5 ug/dL

## 2016-08-15 MED ORDER — SODIUM CHLORIDE 0.9 % IV SOLN: 500.0000 mL | INTRAVENOUS | Status: DC

## 2016-08-15 NOTE — Patient Instructions (Signed)
Impression/Recommendations:  Resume previous diet. Continue present medications.  YOU HAD AN ENDOSCOPIC PROCEDURE TODAY AT THE [facility]:   Refer to the procedure report that was given to you for any specific questions about what was found during the examination.  If the procedure report does not answer your questions, please call your gastroenterologist to clarify.  If you requested that your care partner not be given the details of your procedure findings, then the procedure report has been included in a sealed envelope for you to review at your convenience later.  YOU SHOULD EXPECT: Some feelings of bloating in the abdomen. Passage of more gas than usual.  Walking can help get rid of the air that was put into your GI tract during the procedure and reduce the bloating. If you had a lower endoscopy (such as a colonoscopy or flexible sigmoidoscopy) you may notice spotting of blood in your stool or on the toilet paper. If you underwent a bowel prep for your procedure, you may not have a normal bowel movement for a few days.  Please Note:  You might notice some irritation and congestion in your nose or some drainage.  This is from the oxygen used during your procedure.  There is no need for concern and it should clear up in a day or so.  SYMPTOMS TO REPORT IMMEDIATELY:  Following upper endoscopy (EGD)  Vomiting of blood or coffee ground material  New chest pain or pain under the shoulder blades  Painful or persistently difficult swallowing  New shortness of breath  Fever of 100F or higher  Black, tarry-looking stools  For urgent or emergent issues, a gastroenterologist can be reached at any hour by calling [phone].   DIET:  We do recommend a small meal at first, but then you may proceed to your regular diet.  Drink plenty of fluids but you should avoid alcoholic beverages for 24 hours.  ACTIVITY:  You should plan to take it easy for the rest of today and you should NOT  DRIVE or use heavy machinery until tomorrow (because of the sedation medicines used during the test).    FOLLOW UP: Our staff will call the number listed on your records the next business day following your procedure to check on you and address any questions or concerns that you may have regarding the information given to you following your procedure. If we do not reach you, we will leave a message.  However, if you are feeling well and you are not experiencing any problems, there is no need to return our call.  We will assume that you have returned to your regular daily activities without incident.  If any biopsies were taken you will be contacted by phone or by letter within the next 1-3 weeks.  Please call us at [phone] if you have not heard about the biopsies in 3 weeks.    SIGNATURES/CONFIDENTIALITY: You and/or your care partner have signed paperwork which will be entered into your electronic medical record.  These signatures attest to the fact that that the information above on your After Visit Summary has been reviewed and is understood.  Full responsibility of the confidentiality of this discharge information lies with you and/or your care-partner. 

## 2016-08-15 NOTE — Progress Notes (Signed)
Called to room to assist during endoscopic procedure.  Patient ID and intended procedure confirmed with present staff. Received instructions for my participation in the procedure from the performing physician.  

## 2016-08-15 NOTE — Op Note (Signed)
[facility] Patient Name: Raymond SinnerJoseph Shuping Procedure Date: 08/15/2016 8:23 AM MRN: [id number] Endoscopist: Sherilyn CooterHenry L. Myrtie Neitheranis , MD Age: 26 Referring MD:  Date of Birth: [birth year] Gender: Male Account #: [id number] Procedure:                Upper GI endoscopy Indications:              Epigastric abdominal pain, Weight loss Medicines:                Monitored Anesthesia Care Procedure:                Pre-Anesthesia Assessment:                           - Prior to the procedure, a History and Physical                            was performed, and patient medications and                            allergies were reviewed. The patient's tolerance of                            previous anesthesia was also reviewed. The risks                            and benefits of the procedure and the sedation                            options and risks were discussed with the patient.                            All questions were answered, and informed consent                            was obtained. Prior Anticoagulants: The patient has                            taken no previous anticoagulant or antiplatelet                            agents. ASA Grade Assessment: II - A patient with                            mild systemic disease. After reviewing the risks                            and benefits, the patient was deemed in                            satisfactory condition to undergo the procedure.                           After obtaining informed consent, the endoscope was  passed under direct vision. Throughout the                            procedure, the patient's blood pressure, pulse, and                            oxygen saturations were monitored continuously. The                            Endoscope was introduced through the mouth, and                            advanced to the second part of duodenum. The upper                            GI endoscopy was  accomplished without difficulty.                            The patient tolerated the procedure well. Scope In: Scope Out: Findings:                 The esophagus was normal.                           The entire examined stomach was normal. Biopsies                            were taken with a cold forceps for histology to                            rule out H. pylori.                           The cardia and gastric fundus were normal on                            retroflexion.                           The examined duodenum was normal. Complications:            No immediate complications. Estimated Blood Loss:     Estimated blood loss: none. Impression:               - Normal esophagus.                           - Normal stomach. Biopsied.                           - Normal examined duodenum.                           No visible cause for symptoms seen on this exam.                           Recent CTAP scan and CBC/CMP normal. TSH  and                            cortisol level pending. Recommendation:           - Patient has a contact number available for                            emergencies. The signs and symptoms of potential                            delayed complications were discussed with the                            patient. Return to normal activities tomorrow.                            Written discharge instructions were provided to the                            patient.                           - Resume previous diet.                           - Continue present medications.                           - Await pathology results.                           - The patient was advised to seek primary care                            evaluation. Henry L. Myrtie Neither, MD 08/15/2016 8:51:22 AM This report has been signed electronically.

## 2016-08-18 ENCOUNTER — Telehealth: Payer: Self-pay | Admitting: *Deleted

## 2016-08-18 NOTE — Telephone Encounter (Signed)
Left message on f/u call 

## 2016-08-19 ENCOUNTER — Telehealth: Payer: Self-pay | Admitting: *Deleted

## 2016-08-19 ENCOUNTER — Telehealth: Payer: Self-pay | Admitting: Family

## 2016-08-19 NOTE — Telephone Encounter (Signed)
GI patient.  Last seen and told he needed PCP to treat stress.  Tammy SoursGreg, can you take patient on?

## 2016-08-19 NOTE — Telephone Encounter (Signed)
No answer for second call back. Left patient message to call with any questions or concerns. SM

## 2016-08-19 NOTE — Telephone Encounter (Signed)
Pt called back and doing okay.

## 2016-08-25 NOTE — Telephone Encounter (Signed)
I am unable to take on new patients at this time.

## 2016-08-26 NOTE — Telephone Encounter (Signed)
Dr. Jones would you be able to take patient on? °

## 2016-09-29 NOTE — Telephone Encounter (Signed)
Left patient vm to call back to establish care with shambley.

## 2017-08-19 ENCOUNTER — Encounter: Payer: Self-pay | Admitting: Gastroenterology

## 2017-08-21 ENCOUNTER — Other Ambulatory Visit: Payer: Self-pay

## 2017-08-21 ENCOUNTER — Encounter: Payer: Self-pay | Admitting: Emergency Medicine

## 2017-08-21 ENCOUNTER — Emergency Department (INDEPENDENT_AMBULATORY_CARE_PROVIDER_SITE_OTHER)
Admission: EM | Admit: 2017-08-21 | Discharge: 2017-08-21 | Disposition: A | Discharge status: Home / Self Care | Payer: 59 | Source: Home / Self Care

## 2017-08-21 NOTE — ED Provider Notes (Signed)
[facility], CMA, who triaged pt, pt was accompanied by mother. Per direction of his mother, pt decided not to be evaluated due to [facility] stating certain controlled substances, including the requested Xanax, would not be prescribed today.   NO CHARGE. Pt was not evaluated by provider per pt's choice.    Lurene Shadowhelps, Toney Lizaola O, [place]-C 08/21/17 1109

## 2017-08-21 NOTE — ED Triage Notes (Signed)
Pt c/o of cp & anxiety. Mother presents at bedside. States he needs Xanax because he needs to work. Instructed he signed a prescription policy and none of those meds are given. Pt LWOBS by provider

## 2017-09-22 ENCOUNTER — Ambulatory Visit: Payer: BLUE CROSS/BLUE SHIELD | Admitting: Gastroenterology

## 2018-10-14 ENCOUNTER — Encounter (HOSPITAL_COMMUNITY): Payer: Self-pay | Admitting: Emergency Medicine

## 2018-10-14 ENCOUNTER — Other Ambulatory Visit: Payer: Self-pay

## 2018-10-14 ENCOUNTER — Emergency Department (HOSPITAL_COMMUNITY)
Admission: EM | Admit: 2018-10-14 | Discharge: 2018-10-14 | Disposition: A | Discharge status: Home / Self Care | Payer: Self-pay | Attending: Emergency Medicine | Admitting: Emergency Medicine

## 2018-10-14 ENCOUNTER — Emergency Department (HOSPITAL_COMMUNITY): Payer: Self-pay

## 2018-10-14 DIAGNOSIS — Z20828 Contact with and (suspected) exposure to other viral communicable diseases: Secondary | ICD-10-CM | POA: Insufficient documentation

## 2018-10-14 DIAGNOSIS — Z79899 Other long term (current) drug therapy: Secondary | ICD-10-CM | POA: Insufficient documentation

## 2018-10-14 DIAGNOSIS — F17210 Nicotine dependence, cigarettes, uncomplicated: Secondary | ICD-10-CM | POA: Insufficient documentation

## 2018-10-14 DIAGNOSIS — J189 Pneumonia, unspecified organism: Secondary | ICD-10-CM | POA: Insufficient documentation

## 2018-10-14 LAB — CBC WITH DIFFERENTIAL/PLATELET
Abs Immature Granulocytes: 0.03 10*3/uL (ref 0.00–0.07)
Basophils Absolute: 0 10*3/uL (ref 0.0–0.1)
Basophils Relative: 0 %
Eosinophils Absolute: 0.3 10*3/uL (ref 0.0–0.5)
Eosinophils Relative: 3 %
HCT: 43.8 % (ref 39.0–52.0)
Hemoglobin: 15.8 g/dL (ref 13.0–17.0)
Immature Granulocytes: 0 %
Lymphocytes Relative: 35 %
Lymphs Abs: 2.6 10*3/uL (ref 0.7–4.0)
MCH: 33.3 pg (ref 26.0–34.0)
MCHC: 36.1 g/dL — ABNORMAL HIGH (ref 30.0–36.0)
MCV: 92.4 fL (ref 80.0–100.0)
Monocytes Absolute: 0.6 10*3/uL (ref 0.1–1.0)
Monocytes Relative: 8 %
Neutro Abs: 3.9 10*3/uL (ref 1.7–7.7)
Neutrophils Relative %: 54 %
Platelets: 195 10*3/uL (ref 150–400)
RBC: 4.74 MIL/uL (ref 4.22–5.81)
RDW: 11.9 % (ref 11.5–15.5)
WBC: 7.4 10*3/uL (ref 4.0–10.5)
nRBC: 0 % (ref 0.0–0.2)

## 2018-10-14 LAB — BASIC METABOLIC PANEL
Anion gap: 8 (ref 5–15)
BUN: 23 mg/dL — ABNORMAL HIGH (ref 6–20)
CO2: 24 mmol/L (ref 22–32)
Calcium: 9.2 mg/dL (ref 8.9–10.3)
Chloride: 106 mmol/L (ref 98–111)
Creatinine, Ser: 1.29 mg/dL — ABNORMAL HIGH (ref 0.61–1.24)
GFR calc Af Amer: >60 mL/min (ref >60)
GFR calc non Af Amer: >60 mL/min (ref >60)
Glucose, Bld: 89 mg/dL (ref 70–99)
Potassium: 3.9 mmol/L (ref 3.5–5.1)
Sodium: 138 mmol/L (ref 135–145)

## 2018-10-14 MED ORDER — ALBUTEROL SULFATE HFA 108 (90 BASE) MCG/ACT IN AERS
2.0000 | INHALATION_SPRAY | RESPIRATORY_TRACT | Status: DC | PRN
  Administered 2018-10-14: 2 via RESPIRATORY_TRACT
  Filled 2018-10-14: qty 6.7

## 2018-10-14 MED ORDER — DOXYCYCLINE HYCLATE 100 MG PO CAPS: 100.0000 mg | ORAL_CAPSULE | Freq: Two times a day (BID) | ORAL | 0 refills | Status: DC

## 2018-10-14 MED ORDER — PREDNISONE 20 MG PO TABS: 40.0000 mg | ORAL_TABLET | Freq: Every day | ORAL | 0 refills | Status: DC

## 2018-10-14 NOTE — ED Triage Notes (Signed)
Patient reports SOB with chest congestion and productive cough this morning , denies fever or chills .

## 2018-10-14 NOTE — ED Provider Notes (Signed)
[place] EMERGENCY DEPARTMENT Provider Note   CSN: [id number] Arrival date & time: 10/14/18  0457     History   Chief Complaint Chief Complaint  Patient presents with  . Shortness of Breath  . Cough    HPI Raymond [facility] is a 28 y.o. male.     HPI Patient presents with cough and shortness of breath.  Began around 3 in the morning.  States it was so bad he had to lie in the floor.  Mild sputum production.  No fevers.  States he does feel kind of weak all over.  He is a smoker.  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  Also worried about fungus because when he is exposed to.  Some dull chest pain.  States he is otherwise healthy. Past Medical History:  Diagnosis Date  . Anxiety   . GERD (gastroesophageal reflux disease)   . Post traumatic stress disorder (PTSD)     Patient Active Problem List   Diagnosis Date Noted  . Abdominal pain, epigastric 08/13/2016  . Odynophagia 08/13/2016  . Weight loss 08/13/2016    Past Surgical History:  Procedure Laterality Date  . FOOT ARTHROTOMY    . KNEE ARTHROSCOPY    . TESTICLE SURGERY          Home Medications    Prior to Admission medications   Medication Sig Start Date End Date Taking? Authorizing Provider  APPLE CIDER VINEGAR PO Take by mouth.    [provider]  doxycycline (VIBRAMYCIN) 100 MG capsule Take 1 capsule (100 mg total) by mouth 2 (two) times daily. 10/14/18   Davonna Belling, MD  HYDROcodone-acetaminophen (NORCO) 10-325 MG tablet Take 1 tablet by mouth every 6 (six) hours as needed.    [provider]  Omega-3 Fatty Acids (FISH OIL CONCENTRATE PO) Take by mouth.    [provider]  omeprazole (PRILOSEC) 40 MG capsule Take 1 capsule (40 mg total) by mouth 2 (two) times daily. 08/13/16   Zehr, Laban Emperor, PA-C  predniSONE (DELTASONE) 20 MG tablet Take 2 tablets (40 mg total) by mouth daily. 10/14/18   Davonna Belling, MD   sucralfate (CARAFATE) 1 GM/10ML suspension Take 10 mLs (1 g total) by mouth 4 (four) times daily -  with meals and at bedtime. 08/13/16   Zehr, Laban Emperor, PA-C  traMADol (ULTRAM) 50 MG tablet Take 1 tablet (50 mg total) by mouth every 8 (eight) hours as needed. 08/13/16   Zehr, Laban Emperor, PA-C    Family History Family History  Problem Relation Age of Onset  . Hypertension Mother   . Colon cancer Neg Hx   . Esophageal cancer Neg Hx   . Stomach cancer Neg Hx     Social History Social History   Tobacco Use  . Smoking status: Current Every Day Smoker    Types: Cigarettes  . Smokeless tobacco: Current User  Substance Use Topics  . Alcohol use: Yes  . Drug use: Yes    Types: Marijuana     Allergies   Demerol [meperidine] and Sulfa antibiotics   Review of Systems Review of Systems  Constitutional: Positive for appetite change and fatigue.  HENT: Negative for congestion.   Respiratory: Positive for cough and shortness of breath.   Cardiovascular: Positive for chest pain.  Gastrointestinal: Negative for abdominal pain.  Genitourinary: Negative for dysuria.  Musculoskeletal: Positive for myalgias.  Skin: Negative for rash.  Neurological: Negative  for weakness.     Physical Exam Updated Vital Signs BP 113/82 (BP Location: Right Arm)   Pulse (!) 52   Temp 97.7 F (36.5 C) (Oral)   Resp 16   Ht 5\' 10"  (1.778 m)   Wt 70.3 kg   SpO2 98%   BMI 22.24 kg/m   Physical Exam Vitals signs and nursing note reviewed.  Constitutional:      Appearance: He is well-developed.  HENT:     Head: Atraumatic.  Neck:     Musculoskeletal: Neck supple.  Cardiovascular:     Rate and Rhythm: Regular rhythm. Bradycardia present.  Pulmonary:     Comments: Rhonchi right mid lung fields.  No respiratory distress. Chest:     Chest wall: No tenderness.  Abdominal:     Tenderness: There is no abdominal tenderness.  Musculoskeletal:     Right lower leg: No edema.     Left lower leg: No  edema.  Skin:    General: Skin is warm.     Capillary Refill: Capillary refill takes less than 2 seconds.  Neurological:     Mental Status: He is oriented to person, place, and time.      ED Treatments / Results  Labs (all labs ordered are listed, but only abnormal results are displayed) Labs Reviewed  CBC WITH DIFFERENTIAL/PLATELET - Abnormal; Notable for the following components:      Result Value   MCHC 36.1 (*)    All other components within normal limits  BASIC METABOLIC PANEL - Abnormal; Notable for the following components:   BUN 23 (*)    Creatinine, Ser 1.29 (*)    All other components within normal limits  NOVEL CORONAVIRUS, NAA (HOSP ORDER, SEND-OUT TO REF LAB; TAT 18-24 HRS)    EKG EKG Interpretation  Date/Time:  Thursday October 14 2018 05:07:57 EDT Ventricular Rate:  46 PR Interval:  166 QRS Duration: 116 QT Interval:  430 QTC Calculation: 376 R Axis:   86 Text Interpretation:  Sinus bradycardia Lateral infarct , age undetermined Abnormal ECG No previous ECGs available Confirmed by [id number] (Zadie Rhine) on 10/14/2018 5:12:41 AM   Radiology Dg Chest 2 View  Result Date: 10/14/2018 CLINICAL DATA:  Shortness of breath and productive cough. EXAM: CHEST - 2 VIEW COMPARISON:  None. FINDINGS: Normal heart size and mediastinal contours. No acute infiltrate or edema. No effusion or pneumothorax. No acute osseous findings. Artifact from EKG leads IMPRESSION: Negative chest. Electronically Signed   By: 12/14/2018 M.D.   On: 10/14/2018 05:55    Procedures Procedures (including critical care time)  Medications Ordered in ED Medications  albuterol (VENTOLIN HFA) 108 (90 Base) MCG/ACT inhaler 2 puff (2 puffs Inhalation Given 10/14/18 0847)     Initial Impression / Assessment and Plan / ED Course  I have reviewed the triage vital signs and the nursing notes.  Pertinent labs & imaging results that were available during my care of the patient were reviewed by  me and considered in my medical decision making (see chart for details).        Patient with cough fatigue.  Mild bradycardia and mild hypotension appear to be baseline for the patient.  X-ray reassuring but does have localizing lung findings on auscultation.  Will treat as a clinical pneumonia.  Will give steroids and antibiotics.  Also will give COVID test.return as an outpatient.  Paulmichael Schreck was evaluated in Emergency Department on 10/14/2018 for the symptoms described in the history of present illness.  He was evaluated in the context of the global COVID-19 pandemic, which necessitated consideration that the patient might be at risk for infection with the SARS-CoV-2 virus that causes COVID-19. Institutional protocols and algorithms that pertain to the evaluation of patients at risk for COVID-19 are in a state of rapid change based on information released by regulatory bodies including the CDC and federal and state organizations. These policies and algorithms were followed during the patient's care in the ED.  Final Clinical Impressions(s) / ED Diagnoses   Final diagnoses:  Community acquired pneumonia of right lung, unspecified part of lung    ED Discharge Orders         Ordered    predniSONE (DELTASONE) 20 MG tablet  Daily     10/14/18 0858    doxycycline (VIBRAMYCIN) 100 MG capsule  2 times daily     10/14/18 0858           Benjiman CorePickering, Akaysha Cobern, MD 10/14/18 [phone]

## 2018-10-14 NOTE — Discharge Instructions (Addendum)
Follow-up with a primary care doctor as needed.  You also have had a COVID test done and the results should come back in 1 to 2 days.

## 2018-10-15 LAB — NOVEL CORONAVIRUS, NAA (HOSP ORDER, SEND-OUT TO REF LAB; TAT 18-24 HRS): SARS-CoV-2, NAA: NOT DETECTED

## 2019-05-06 ENCOUNTER — Emergency Department (INDEPENDENT_AMBULATORY_CARE_PROVIDER_SITE_OTHER)
Admission: EM | Admit: 2019-05-06 | Discharge: 2019-05-06 | Disposition: A | Discharge status: Home / Self Care | Payer: Self-pay | Source: Home / Self Care | Attending: Family Medicine | Admitting: Family Medicine

## 2019-05-06 ENCOUNTER — Other Ambulatory Visit: Payer: Self-pay

## 2019-05-06 ENCOUNTER — Encounter: Payer: Self-pay | Admitting: Emergency Medicine

## 2019-05-06 DIAGNOSIS — J209 Acute bronchitis, unspecified: Secondary | ICD-10-CM

## 2019-05-06 MED ORDER — AZITHROMYCIN 250 MG PO TABS: ORAL_TABLET | ORAL | 0 refills | Status: DC

## 2019-05-06 MED ORDER — ALBUTEROL SULFATE HFA 108 (90 BASE) MCG/ACT IN AERS: 2.0000 | INHALATION_SPRAY | RESPIRATORY_TRACT | 0 refills | Status: DC | PRN

## 2019-05-06 MED ORDER — PREDNISONE 20 MG PO TABS: ORAL_TABLET | ORAL | 0 refills | Status: DC

## 2019-05-06 NOTE — ED Provider Notes (Signed)
[facility]    CSN: [id number] Arrival date & time: 05/06/19  1137      History   Chief Complaint Chief Complaint  Patient presents with  . Cough  . Nasal Congestion    HPI Raymond Sanders is a 29 y.o. male.   Patient developed a dry cough about 5 days ago, now productive of greenish sputum.  He has had an increase in nasal congestion, but denies sore throat.   He denies changes in taste/smell.  He has tightness in his anterior chest but denies pleuritic pain, and has occasional shortness of breath/wheezing.  He denies fevers, chills, and sweats and myalgias but has had headache. He had pneumonia in 2020. He has a family history of asthma (brother).  Patient vapes.    The history is provided by the patient.    Past Medical History:  Diagnosis Date  . Anxiety   . GERD (gastroesophageal reflux disease)   . Post traumatic stress disorder (PTSD)     Patient Active Problem List   Diagnosis Date Noted  . Abdominal pain, epigastric 08/13/2016  . Odynophagia 08/13/2016  . Weight loss 08/13/2016    Past Surgical History:  Procedure Laterality Date  . FOOT ARTHROTOMY    . KNEE ARTHROSCOPY    . TESTICLE SURGERY         Home Medications    Prior to Admission medications   Medication Sig Start Date End Date Taking? Authorizing Provider  albuterol (VENTOLIN HFA) 108 (90 Base) MCG/ACT inhaler Inhale 2 puffs into the lungs every 4 (four) hours as needed for wheezing or shortness of breath. 05/06/19   Lattie Haw, MD  APPLE CIDER VINEGAR PO Take by mouth.    [provider]  azithromycin (ZITHROMAX Z-PAK) 250 MG tablet Take 2 tabs today; then begin one tab once daily for 4 more days. 05/06/19   Lattie Haw, MD  Omega-3 Fatty Acids (FISH OIL CONCENTRATE PO) Take by mouth.    [provider]  omeprazole (PRILOSEC) 40 MG capsule Take 1 capsule (40 mg total) by mouth 2 (two) times daily. 08/13/16   Zehr, Princella Pellegrini, PA-C  predniSONE (DELTASONE)  20 MG tablet Take one tab by mouth twice daily for 4 days, then one daily for 3 days. Take with food. 05/06/19   Lattie Haw, MD  sucralfate (CARAFATE) 1 GM/10ML suspension Take 10 mLs (1 g total) by mouth 4 (four) times daily -  with meals and at bedtime. 08/13/16   Zehr, Princella Pellegrini, PA-C  traMADol (ULTRAM) 50 MG tablet Take 1 tablet (50 mg total) by mouth every 8 (eight) hours as needed. 08/13/16   Zehr, Princella Pellegrini, PA-C    Family History Family History  Problem Relation Age of Onset  . Hypertension Mother   . Healthy Father   . Colon cancer Neg Hx   . Esophageal cancer Neg Hx   . Stomach cancer Neg Hx     Social History Social History   Tobacco Use  . Smoking status: Current Every Day Smoker    Types: Cigarettes    Last attempt to quit: 11/14/2018    Years since quitting: 0.4  . Smokeless tobacco: Never Used  Substance Use Topics  . Alcohol use: Not Currently  . Drug use: Not Currently    Types: Marijuana     Allergies   Demerol [meperidine] and Sulfa antibiotics   Review of Systems Review of Systems No sore throat + cough No pleuritic pain, but  feels tight in anterior chest. + wheezing + nasal congestion + post-nasal drainage No sinus pain/pressure No itchy/red eyes No earache No hemoptysis No SOB No fever/chills No nausea No vomiting No abdominal pain No diarrhea No urinary symptoms No skin rash + fatigue No myalgias No headache    Physical Exam Triage Vital Signs ED Triage Vitals  Enc Vitals Group     BP 05/06/19 1212 112/69     Pulse Rate 05/06/19 1212 72     Resp 05/06/19 1212 17     Temp 05/06/19 1212 99.1 F (37.3 C)     Temp Source 05/06/19 1212 Oral     SpO2 05/06/19 1212 97 %     Weight 05/06/19 1217 150 lb (68 kg)     Height 05/06/19 1217 5\' 10"  (1.778 m)     Head Circumference --      Peak Flow --      Pain Score 05/06/19 1216 4     Pain Loc --      Pain Edu? --      Excl. in [place]? --    No data found.  Updated Vital  Signs BP 112/69 (BP Location: Right Arm)   Pulse 72   Temp 99.1 F (37.3 C) (Oral)   Resp 17   Ht 5\' 10"  (1.778 m)   Wt 68 kg   SpO2 97%   BMI 21.52 kg/m   Visual Acuity Right Eye Distance:   Left Eye Distance:   Bilateral Distance:    Right Eye Near:   Left Eye Near:    Bilateral Near:     Physical Exam Nursing notes and Vital Signs reviewed. Appearance:  Patient appears stated age, and in no acute distress Eyes:  Pupils are equal, round, and reactive to light and accomodation.  Extraocular movement is intact.  Conjunctivae are not inflamed  Ears:  Right canal occluded with cerumen.  Left canal and tympanic membrane normal. Nose:  Mildly congested turbinates.  No sinus tenderness.   Pharynx:  Normal Neck:  Supple.  Mildly enlarged lateral nodes are present, tender to palpation on the left.   Lungs:  Faint bilateral wheeze anteriorly.  Breath sounds are equal.  Moving air well. Heart:  Regular rate and rhythm without murmurs, rubs, or gallops.  Abdomen:  Nontender without masses or hepatosplenomegaly.  Bowel sounds are present.  No CVA or flank tenderness.  Extremities:  No edema.  Skin:  No rash present.   UC Treatments / Results  Labs (all labs ordered are listed, but only abnormal results are displayed) Labs Reviewed - No data to display  EKG   Radiology No results found.  Procedures Procedures (including critical care time)  Medications Ordered in UC Medications - No data to display  Initial Impression / Assessment and Plan / UC Course  I have reviewed the triage vital signs and the nursing notes.  Pertinent labs & imaging results that were available during my care of the patient were reviewed by me and considered in my medical decision making (see chart for details).    Suspect a component of reactive airways disease. Begin prednisone burst/taper and Z-pak. Rx for albuterol MDI. Followup with Family Doctor if not improved in about 10 days.   Final  Clinical Impressions(s) / UC Diagnoses   Final diagnoses:  Acute bronchitis, unspecified organism     Discharge Instructions     Take plain guaifenesin (1200mg  extended release tabs such as Mucinex) twice daily, with plenty of water,  for cough and congestion.  May add Pseudoephedrine (30mg , one or two every 4 to 6 hours) for sinus congestion.  Get adequate rest.   May take Delsym Cough Suppressant at bedtime for nighttime cough.  Stop all antihistamines for now, and other non-prescription cough/cold preparations.       ED Prescriptions    Medication Sig Dispense Auth. Provider   azithromycin (ZITHROMAX Z-PAK) 250 MG tablet Take 2 tabs today; then begin one tab once daily for 4 more days. 6 tablet , MD   predniSONE (DELTASONE) 20 MG tablet Take one tab by mouth twice daily for 4 days, then one daily for 3 days. Take with food. 11 tablet Lattie Haw, MD   albuterol (VENTOLIN HFA) 108 (90 Base) MCG/ACT inhaler Inhale 2 puffs into the lungs every 4 (four) hours as needed for wheezing or shortness of breath. 8 g Lattie Haw, MD        Lattie Haw, MD 05/06/19 1256

## 2019-05-06 NOTE — Discharge Instructions (Addendum)
Take plain guaifenesin (1200mg extended release tabs such as Mucinex) twice daily, with plenty of water, for cough and congestion.  May add Pseudoephedrine (30mg, one or two every 4 to 6 hours) for sinus congestion.  Get adequate rest.   °May take Delsym Cough Suppressant at bedtime for nighttime cough.  °Stop all antihistamines for now, and other non-prescription cough/cold preparations. °  °

## 2019-05-06 NOTE — ED Triage Notes (Signed)
Cough started 3 days ago- green drainage Congestion for 1 week  Cough worse at night No OTC meds No Fever No seasonal allergies - outside more this year , increased congestion

## 2020-09-01 ENCOUNTER — Other Ambulatory Visit: Payer: Self-pay

## 2020-09-01 ENCOUNTER — Emergency Department
Admission: EM | Admit: 2020-09-01 | Discharge: 2020-09-01 | Disposition: A | Discharge status: Home / Self Care | Payer: Self-pay | Source: Home / Self Care | Attending: Family Medicine | Admitting: Family Medicine

## 2020-09-01 DIAGNOSIS — U071 COVID-19: Secondary | ICD-10-CM

## 2020-09-01 LAB — POC SARS CORONAVIRUS 2 AG -  ED: SARS Coronavirus 2 Ag: POSITIVE — AB

## 2020-09-01 MED ORDER — NIRMATRELVIR/RITONAVIR (PAXLOVID)TABLET: 3.0000 | ORAL_TABLET | Freq: Two times a day (BID) | ORAL | 0 refills | Status: AC

## 2020-09-01 MED ORDER — ALBUTEROL SULFATE HFA 108 (90 BASE) MCG/ACT IN AERS: 2.0000 | INHALATION_SPRAY | RESPIRATORY_TRACT | 0 refills | Status: DC | PRN

## 2020-09-01 NOTE — ED Provider Notes (Signed)
[facility]    CSN: [id number] Arrival date & time: 09/01/20  1302      History   Chief Complaint Chief Complaint  Patient presents with   Cough   Fever   Generalized Body Aches    HPI Raymond [facility] is a 30 y.o. male.   HPI  Patient states he had COVID in January 2022.  This infection feels worse.  He has cough, runny stuffy nose, fever, body aches, and fatigue.  Symptoms been present for 2 days, going on 3.  He has not done COVID testing.  Past Medical History:  Diagnosis Date   Anxiety    GERD (gastroesophageal reflux disease)    Post traumatic stress disorder (PTSD)     Patient Active Problem List   Diagnosis Date Noted   Abdominal pain, epigastric 08/13/2016   Odynophagia 08/13/2016   Weight loss 08/13/2016    Past Surgical History:  Procedure Laterality Date   FOOT ARTHROTOMY     KNEE ARTHROSCOPY     TESTICLE SURGERY         Home Medications    Prior to Admission medications   Medication Sig Start Date End Date Taking? Authorizing Provider  ibuprofen (ADVIL) 400 MG tablet Take 400 mg by mouth every 6 (six) hours as needed.   Yes [provider]  nirmatrelvir/ritonavir EUA (PAXLOVID) 20 x 150 MG & 10 x 100MG  TABS Take 3 tablets by mouth 2 (two) times daily for 5 days. Patient GFR is >100. Take nirmatrelvir (150 mg) two tablets twice daily for 5 days and ritonavir (100 mg) one tablet twice daily for 5 days. 09/01/20 09/06/20 Yes 09/08/20, MD  Pseudoeph-Doxylamine-DM-APAP (DAYQUIL/NYQUIL COLD/FLU RELIEF PO) Take 1 tablet by mouth.   Yes [provider]  albuterol (VENTOLIN HFA) 108 (90 Base) MCG/ACT inhaler Inhale 2 puffs into the lungs every 4 (four) hours as needed for wheezing or shortness of breath. 09/01/20   09/03/20, MD  APPLE CIDER VINEGAR PO Take by mouth.    [provider]  Omega-3 Fatty Acids (FISH OIL CONCENTRATE PO) Take by mouth.    [provider]    Family History Family  History  Problem Relation Age of Onset   Hypertension Mother    Healthy Father    Colon cancer Neg Hx    Esophageal cancer Neg Hx    Stomach cancer Neg Hx     Social History Social History   Tobacco Use   Smoking status: Former    Types: Cigarettes    Quit date: 11/14/2018    Years since quitting: 1.8   Smokeless tobacco: Never  Vaping Use   Vaping Use: Every day   Substances: Nicotine  Substance Use Topics   Alcohol use: Not Currently    Alcohol/week: 7.0 - 14.0 standard drinks    Types: 7 - 14 Standard drinks or equivalent per week    Comment: weekly   Drug use: Yes    Types: Marijuana    Comment: occasionally     Allergies   Demerol [meperidine] and Sulfa antibiotics   Review of Systems Review of Systems See HPI  Physical Exam Triage Vital Signs ED Triage Vitals  Enc Vitals Group     BP 09/01/20 1348 112/75     Pulse Rate 09/01/20 1348 73     Resp 09/01/20 1348 20     Temp 09/01/20 1348 98.4 F (36.9 C)     Temp src --  SpO2 09/01/20 1348 99 %     Weight 09/01/20 1341 145 lb (65.8 kg)     Height 09/01/20 1341 5\' 11"  (1.803 m)     Head Circumference --      Peak Flow --      Pain Score 09/01/20 1340 6     Pain Loc --      Pain Edu? --      Excl. in [place]? --    No data found.  Updated Vital Signs BP 112/75 (BP Location: Right Arm)   Pulse 73   Temp 98.4 F (36.9 C)   Resp 20   Ht 5\' 11"  (1.803 m)   Wt 65.8 kg   SpO2 99%   BMI 20.22 kg/m     Physical Exam Constitutional:      General: He is not in acute distress.    Appearance: Normal appearance. He is well-developed. He is ill-appearing.  HENT:     Head: Normocephalic and atraumatic.     Mouth/Throat:     Comments: Mask is in place Eyes:     Conjunctiva/sclera: Conjunctivae normal.     Pupils: Pupils are equal, round, and reactive to light.  Cardiovascular:     Rate and Rhythm: Normal rate and regular rhythm.     Heart sounds: Normal heart sounds.  Pulmonary:     Effort:  Pulmonary effort is normal. No respiratory distress.     Breath sounds: Normal breath sounds. No wheezing or rales.  Abdominal:     General: There is no distension.     Palpations: Abdomen is soft.  Musculoskeletal:        General: Normal range of motion.     Cervical back: Normal range of motion.  Lymphadenopathy:     Cervical: No cervical adenopathy.  Skin:    General: Skin is warm and dry.  Neurological:     Mental Status: He is alert.  Psychiatric:        Mood and Affect: Mood normal.        Behavior: Behavior normal.     UC Treatments / Results  Labs (all labs ordered are listed, but only abnormal results are displayed) Labs Reviewed  POC SARS CORONAVIRUS 2 AG -  ED - Abnormal; Notable for the following components:      Result Value   SARS Coronavirus 2 Ag Positive (*)    All other components within normal limits    EKG   Radiology No results found.  Procedures Procedures (including critical care time)  Medications Ordered in UC Medications - No data to display  Initial Impression / Assessment and Plan / UC Course  I have reviewed the triage vital signs and the nursing notes.  Pertinent labs & imaging results that were available during my care of the patient were reviewed by me and considered in my medical decision making (see chart for details).     Patient's positive for COVID.  He is requesting packs of it.  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  He notes that he has to quarantine for 5 days after symptom onset.  May return to work at that time if his symptoms have improved and he is afebrile.  Mask wearing for full 10 days as discussed. Final Clinical Impressions(s) / UC Diagnoses   Final diagnoses:  COVID-19     Discharge Instructions  Take antiviral medicine as directed on package COVID symptom management is attached You must quarantine for 5 days then wear mask for  5 days to prevent spread of COVID   ED Prescriptions     Medication Sig Dispense Auth. Provider   albuterol (VENTOLIN HFA) 108 (90 Base) MCG/ACT inhaler Inhale 2 puffs into the lungs every 4 (four) hours as needed for wheezing or shortness of breath. 18 g Eustace Moore, MD   nirmatrelvir/ritonavir EUA (PAXLOVID) 20 x 150 MG & 10 x 100MG  TABS Take 3 tablets by mouth 2 (two) times daily for 5 days. Patient GFR is >100. Take nirmatrelvir (150 mg) two tablets twice daily for 5 days and ritonavir (100 mg) one tablet twice daily for 5 days. 30 tablet , MD      PDMP not reviewed this encounter.   Eustace Moore, MD 09/01/20 [phone]

## 2020-09-01 NOTE — ED Triage Notes (Signed)
Pt presents to Urgent Care with c/o body aches, cough, headache, and fever x 2 days. Also states he has had dizziness/light-headedness. Has not taken home COVID test; has not been vaccinated.

## 2020-09-01 NOTE — Discharge Instructions (Addendum)
Take antiviral medicine as directed on package COVID symptom management is attached You must quarantine for 5 days then wear mask for 5 days to prevent spread of COVID

## 2021-08-07 ENCOUNTER — Emergency Department
Admission: EM | Admit: 2021-08-07 | Discharge: 2021-08-07 | Disposition: A | Discharge status: Home / Self Care | Payer: Medicaid Other | Attending: Family Medicine | Admitting: Family Medicine

## 2021-08-07 ENCOUNTER — Encounter: Payer: Self-pay | Admitting: Emergency Medicine

## 2021-08-07 DIAGNOSIS — B09 Unspecified viral infection characterized by skin and mucous membrane lesions: Secondary | ICD-10-CM

## 2021-08-07 MED ORDER — IBUPROFEN 800 MG PO TABS: 800.0000 mg | ORAL_TABLET | Freq: Three times a day (TID) | ORAL | 0 refills | Status: AC

## 2021-08-07 MED ORDER — GABAPENTIN 300 MG PO CAPS: 300.0000 mg | ORAL_CAPSULE | Freq: Three times a day (TID) | ORAL | 0 refills | Status: AC

## 2021-08-07 MED ORDER — VALACYCLOVIR HCL 1 G PO TABS: 1000.0000 mg | ORAL_TABLET | Freq: Three times a day (TID) | ORAL | 0 refills | Status: AC

## 2021-08-07 NOTE — ED Triage Notes (Signed)
Rash to  right side of chest  w/ blisters  Blisters came up yesterday Itching on Sat & Sunday  Sharp burning pain started on Monday night Cortisone cream  Chicken pox as a child Unable to sleep last night due to pain  Benadryl on Monday

## 2021-08-07 NOTE — Discharge Instructions (Signed)
Take the valacyclovir 3 x a day Take ibuprofen as needed pain Take gabapentin as needed pain.  May cause drowsiness

## 2021-08-07 NOTE — ED Provider Notes (Signed)
[facility]    CSN: [id number] Arrival date & time: 08/07/21  0803      History   Chief Complaint Chief Complaint  Patient presents with   Rash    R chest    HPI Raymond Sanders is a 31 y.o. male.   HPI  Patient states he has an area on his chest that has is painful and broken out with a rash.  He states that the itching and discomfort came first and then the rash broke out yesterday.  He has had chickenpox as a child.  He thought this might be an insect bite.  He has never had a rash like this before.  No history of cold sores or herpes.  Past Medical History:  Diagnosis Date   Anxiety    GERD (gastroesophageal reflux disease)    Post traumatic stress disorder (PTSD)     Patient Active Problem List   Diagnosis Date Noted   Abdominal pain, epigastric 08/13/2016   Odynophagia 08/13/2016   Weight loss 08/13/2016    Past Surgical History:  Procedure Laterality Date   FOOT ARTHROTOMY     KNEE ARTHROSCOPY     TESTICLE SURGERY         Home Medications    Prior to Admission medications   Medication Sig Start Date End Date Taking? Authorizing Provider  gabapentin (NEURONTIN) 300 MG capsule Take 1 capsule (300 mg total) by mouth 3 (three) times daily. May cause drowsiness 08/07/21  Yes Eustace Moore, MD  ibuprofen (ADVIL) 800 MG tablet Take 1 tablet (800 mg total) by mouth 3 (three) times daily. 08/07/21  Yes Eustace Moore, MD  valACYclovir (VALTREX) 1000 MG tablet Take 1 tablet (1,000 mg total) by mouth 3 (three) times daily. 08/07/21  Yes Eustace Moore, MD    Family History Family History  Problem Relation Age of Onset   Hypertension Mother    Healthy Father    Colon cancer Neg Hx    Esophageal cancer Neg Hx    Stomach cancer Neg Hx     Social History Social History   Tobacco Use   Smoking status: Every Day    Types: E-cigarettes, Cigarettes    Last attempt to quit: 11/14/2018    Years since quitting: 2.7   Smokeless tobacco:  Never  Vaping Use   Vaping Use: Every day   Substances: Nicotine, Flavoring  Substance Use Topics   Alcohol use: Not Currently    Alcohol/week: 7.0 - 14.0 standard drinks of alcohol    Types: 7 - 14 Standard drinks or equivalent per week    Comment: weekly   Drug use: Yes    Types: Marijuana    Comment: occasionally     Allergies   Demerol [meperidine] and Sulfa antibiotics   Review of Systems Review of Systems  See HPI Physical Exam Triage Vital Signs ED Triage Vitals  Enc Vitals Group     BP 08/07/21 0818 110/71     Pulse Rate 08/07/21 0818 (!) 58     Resp 08/07/21 0818 14     Temp 08/07/21 0818 98.6 F (37 C)     Temp Source 08/07/21 0818 Oral     SpO2 08/07/21 0818 97 %     Weight 08/07/21 0820 145 lb (65.8 kg)     Height 08/07/21 0820 5\' 11"  (1.803 m)     Head Circumference --      Peak Flow --      Pain Score --  Pain Loc --      Pain Edu? --      Excl. in GC? --    No data found.  Updated Vital Signs BP 110/71 (BP Location: Left Arm)   Pulse (!) 58   Temp 98.6 F (37 C) (Oral)   Resp 14   Ht 5\' 11"  (1.803 m)   Wt 65.8 kg   SpO2 97%   BMI 20.22 kg/m       Physical Exam Constitutional:      General: He is not in acute distress.    Appearance: He is well-developed.  HENT:     Head: Normocephalic and atraumatic.  Eyes:     Conjunctiva/sclera: Conjunctivae normal.     Pupils: Pupils are equal, round, and reactive to light.  Cardiovascular:     Rate and Rhythm: Normal rate.  Pulmonary:     Effort: Pulmonary effort is normal. No respiratory distress.  Abdominal:     General: There is no distension.     Palpations: Abdomen is soft.  Musculoskeletal:        General: Normal range of motion.     Cervical back: Normal range of motion.  Skin:    General: Skin is warm and dry.     Findings: Rash present.     Comments: theres a cluster of fluid filled blisters in an irregularly shaped rash present on the right anterior chest just over the  pectoralis region.  It measures 3 x 5 cm.  Neurological:     Mental Status: He is alert.      UC Treatments / Results  Labs (all labs ordered are listed, but only abnormal results are displayed) Labs Reviewed - No data to display  EKG   Radiology No results found.  Procedures Procedures (including critical care time)  Medications Ordered in UC Medications - No data to display  Initial Impression / Assessment and Plan / UC Course  I have reviewed the triage vital signs and the nursing notes.  Pertinent labs & imaging results that were available during my care of the patient were reviewed by me and considered in my medical decision making (see chart for details).     This looks like a viral rash.  Herpes zoster versus simplex.  We will treat with valacyclovir and gabapentin.  Return as needed Final Clinical Impressions(s) / UC Diagnoses   Final diagnoses:  Viral exanthem     Discharge Instructions      Take the valacyclovir 3 x a day Take ibuprofen as needed pain Take gabapentin as needed pain.  May cause drowsiness     ED Prescriptions     Medication Sig Dispense Auth. Provider   valACYclovir (VALTREX) 1000 MG tablet Take 1 tablet (1,000 mg total) by mouth 3 (three) times daily. 21 tablet , MD   ibuprofen (ADVIL) 800 MG tablet Take 1 tablet (800 mg total) by mouth 3 (three) times daily. 21 tablet Eustace Moore, MD   gabapentin (NEURONTIN) 300 MG capsule Take 1 capsule (300 mg total) by mouth 3 (three) times daily. May cause drowsiness 30 capsule Eustace Moore, MD      PDMP not reviewed this encounter.   Eustace Moore, MD 08/07/21 [phone]

## 2021-08-08 ENCOUNTER — Telehealth: Payer: Self-pay | Admitting: Emergency Medicine

## 2021-08-14 IMAGING — CR DG CHEST 2V
4 series · 4 of 4 positions shown · non-contrast
Comparison: None.

CLINICAL DATA: Shortness of breath and productive cough.

EXAM:
CHEST - 2 VIEW

[chest pa (1 of 2)]
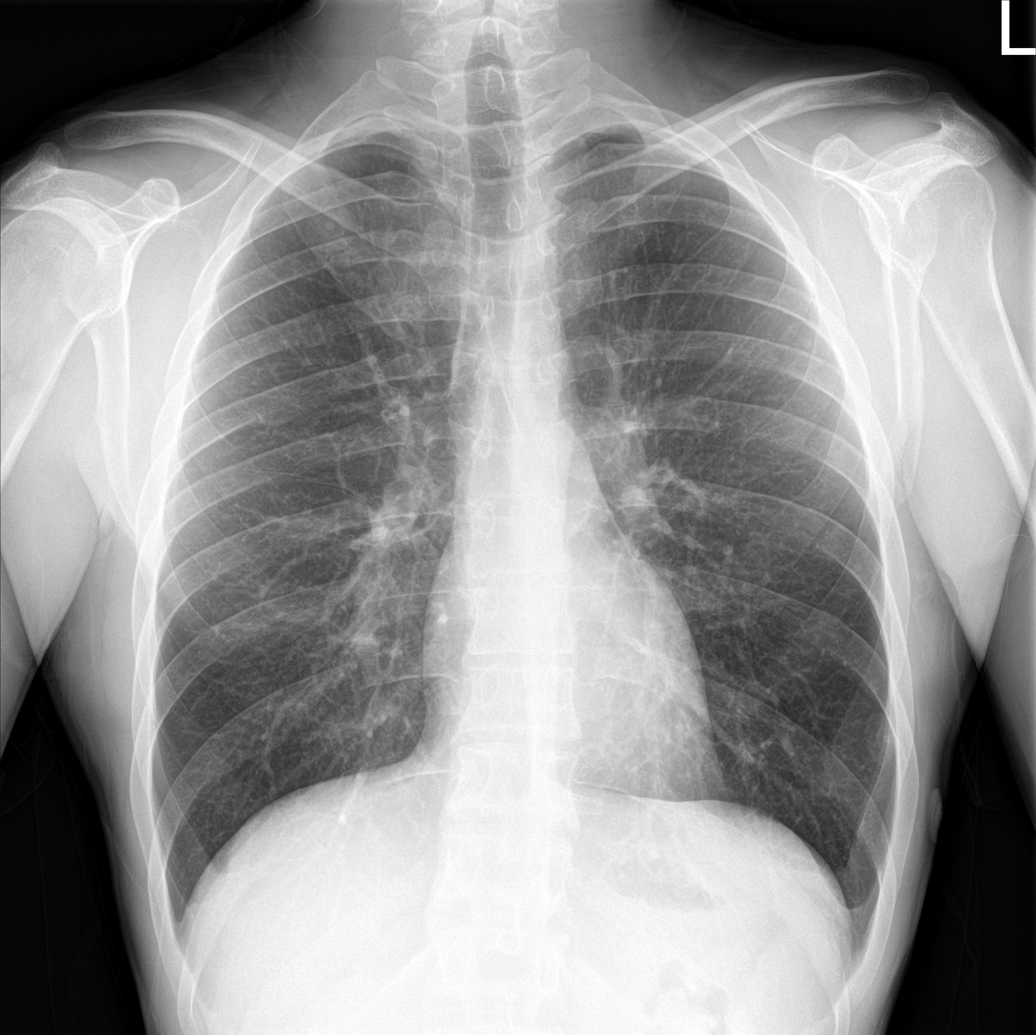

[chest pa (2 of 2)]
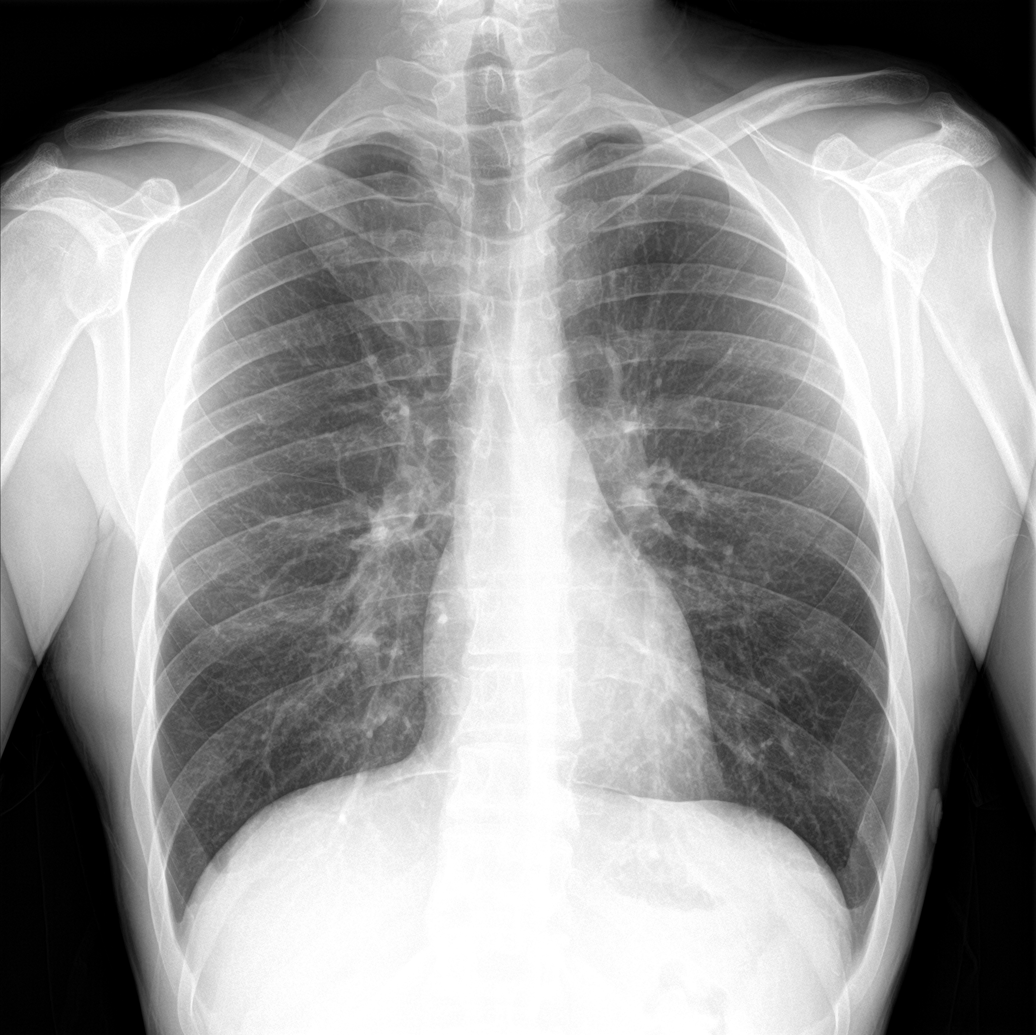

[chest lat (1 of 2)]
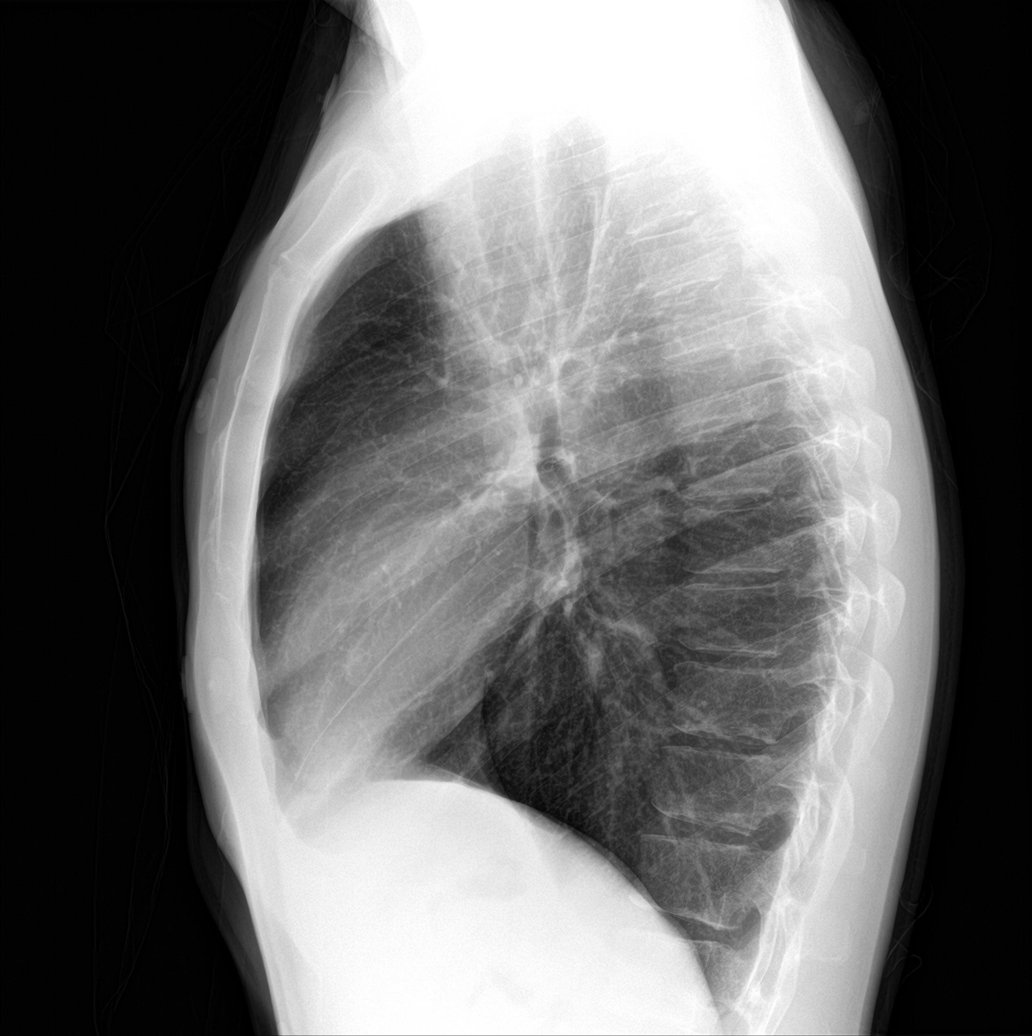

[chest lat (2 of 2)]
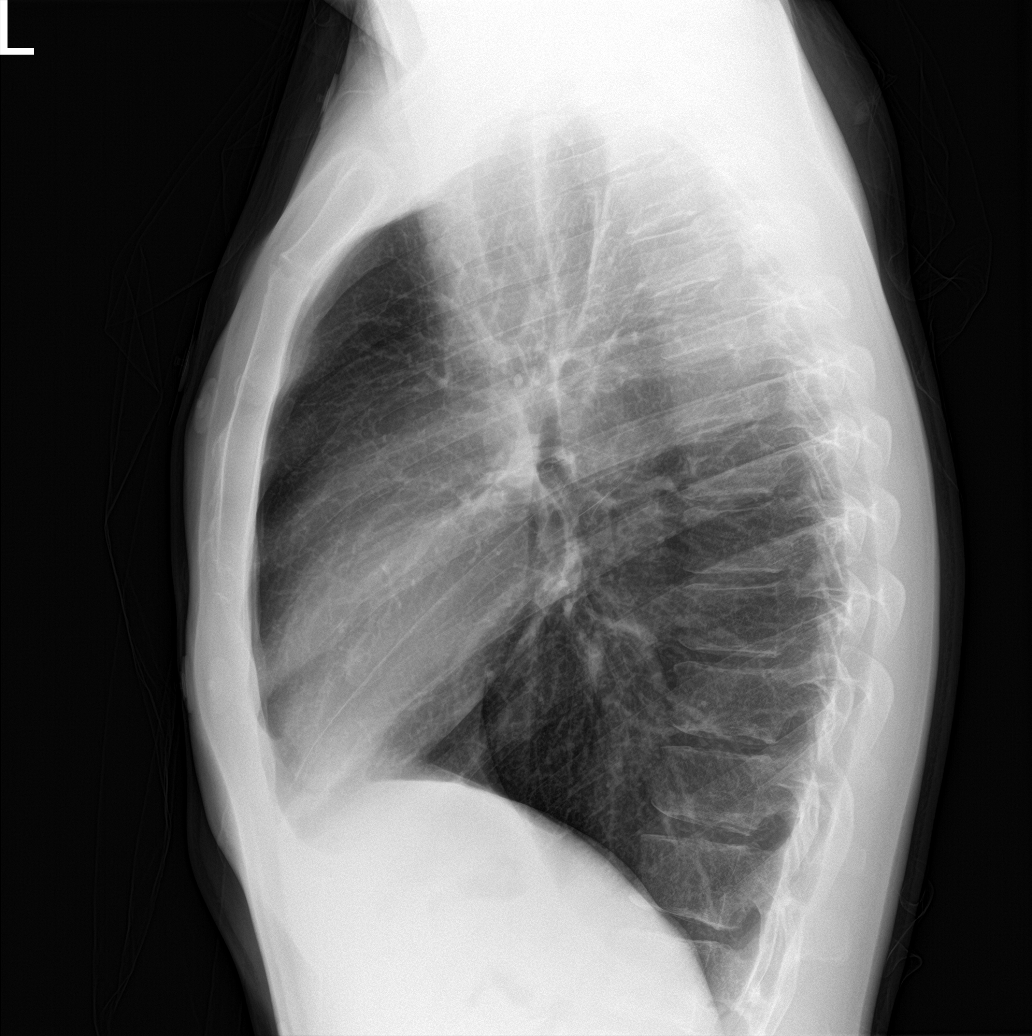

[4 of 4 positions shown; findings below may reference images not displayed]

FINDINGS: Normal heart size and mediastinal contours. No acute infiltrate or
edema. No effusion or pneumothorax. No acute osseous findings.

Artifact from EKG leads
IMPRESSION: Negative chest.
